# Patient Record
Sex: Female | Born: 1985 | Race: White | Hispanic: No | Marital: Married | Smoking: Former smoker
Health system: Southern US, Community
[De-identification: ages and names within clinical notes are randomized; demographics above are authoritative.]

## PROBLEM LIST (undated history)

## (undated) DIAGNOSIS — J45909 Unspecified asthma, uncomplicated: Secondary | ICD-10-CM

## (undated) DIAGNOSIS — F4310 Post-traumatic stress disorder, unspecified: Secondary | ICD-10-CM

## (undated) HISTORY — PX: TUBAL LIGATION: SHX77

## (undated) HISTORY — PX: DILATION AND CURETTAGE OF UTERUS: SHX78

---
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
## 2005-01-06 ENCOUNTER — Emergency Department: Payer: Self-pay | Admitting: Emergency Medicine

## 2005-04-06 ENCOUNTER — Emergency Department: Payer: Self-pay | Admitting: Emergency Medicine

## 2005-04-06 ENCOUNTER — Ambulatory Visit: Payer: Self-pay | Admitting: Emergency Medicine

## 2005-05-09 ENCOUNTER — Inpatient Hospital Stay: Payer: Self-pay | Admitting: Psychiatry

## 2005-08-17 ENCOUNTER — Emergency Department: Payer: Self-pay | Admitting: Emergency Medicine

## 2005-09-19 ENCOUNTER — Emergency Department: Payer: Self-pay | Admitting: Emergency Medicine

## 2005-10-21 ENCOUNTER — Emergency Department: Payer: Self-pay | Admitting: Emergency Medicine

## 2005-10-22 ENCOUNTER — Ambulatory Visit: Payer: Self-pay

## 2005-10-31 ENCOUNTER — Emergency Department: Payer: Self-pay | Admitting: Emergency Medicine

## 2005-12-04 ENCOUNTER — Emergency Department: Payer: Self-pay | Admitting: Emergency Medicine

## 2005-12-04 ENCOUNTER — Other Ambulatory Visit: Payer: Self-pay

## 2005-12-16 ENCOUNTER — Emergency Department: Payer: Self-pay | Admitting: Emergency Medicine

## 2006-01-03 ENCOUNTER — Inpatient Hospital Stay: Payer: Self-pay | Admitting: Psychiatry

## 2006-04-12 ENCOUNTER — Emergency Department: Payer: Self-pay | Admitting: Internal Medicine

## 2006-04-13 ENCOUNTER — Emergency Department: Payer: Self-pay | Admitting: Emergency Medicine

## 2006-06-10 ENCOUNTER — Emergency Department: Payer: Self-pay | Admitting: Emergency Medicine

## 2007-02-14 ENCOUNTER — Emergency Department: Payer: Self-pay | Admitting: Emergency Medicine

## 2007-04-09 ENCOUNTER — Emergency Department: Payer: Self-pay | Admitting: Internal Medicine

## 2007-04-10 ENCOUNTER — Inpatient Hospital Stay: Payer: Self-pay | Admitting: Psychiatry

## 2007-10-21 ENCOUNTER — Emergency Department: Payer: Self-pay | Admitting: Emergency Medicine

## 2007-10-22 ENCOUNTER — Emergency Department: Payer: Self-pay | Admitting: Emergency Medicine

## 2007-10-24 ENCOUNTER — Ambulatory Visit: Payer: Self-pay | Admitting: Emergency Medicine

## 2008-09-30 ENCOUNTER — Encounter: Payer: Self-pay | Admitting: Obstetrics & Gynecology

## 2008-10-15 ENCOUNTER — Emergency Department: Payer: Self-pay | Admitting: Emergency Medicine

## 2008-10-29 ENCOUNTER — Encounter: Payer: Self-pay | Admitting: Obstetrics & Gynecology

## 2008-11-04 ENCOUNTER — Emergency Department: Payer: Self-pay | Admitting: Emergency Medicine

## 2009-02-13 ENCOUNTER — Observation Stay: Payer: Self-pay | Admitting: Obstetrics & Gynecology

## 2009-02-19 ENCOUNTER — Observation Stay: Payer: Self-pay

## 2009-03-16 ENCOUNTER — Observation Stay: Payer: Self-pay

## 2009-03-29 ENCOUNTER — Inpatient Hospital Stay: Payer: Self-pay

## 2009-12-11 ENCOUNTER — Emergency Department: Payer: Self-pay | Admitting: Unknown Physician Specialty

## 2010-08-02 ENCOUNTER — Emergency Department: Payer: Self-pay | Admitting: Internal Medicine

## 2010-11-14 ENCOUNTER — Observation Stay: Payer: Self-pay | Admitting: Obstetrics and Gynecology

## 2010-12-13 ENCOUNTER — Inpatient Hospital Stay: Payer: Self-pay | Admitting: Obstetrics & Gynecology

## 2010-12-18 LAB — PATHOLOGY REPORT

## 2012-01-12 ENCOUNTER — Emergency Department (HOSPITAL_COMMUNITY)
Admission: EM | Admit: 2012-01-12 | Discharge: 2012-01-12 | Disposition: A | Discharge status: Home / Self Care | Payer: Medicaid Other | Attending: Emergency Medicine | Admitting: Emergency Medicine

## 2012-01-12 ENCOUNTER — Encounter (HOSPITAL_COMMUNITY): Payer: Self-pay

## 2012-01-12 ENCOUNTER — Emergency Department (HOSPITAL_COMMUNITY): Payer: Medicaid Other

## 2012-01-12 DIAGNOSIS — Y92009 Unspecified place in unspecified non-institutional (private) residence as the place of occurrence of the external cause: Secondary | ICD-10-CM | POA: Insufficient documentation

## 2012-01-12 DIAGNOSIS — Y93E1 Activity, personal bathing and showering: Secondary | ICD-10-CM | POA: Insufficient documentation

## 2012-01-12 DIAGNOSIS — M545 Low back pain, unspecified: Secondary | ICD-10-CM | POA: Insufficient documentation

## 2012-01-12 DIAGNOSIS — N760 Acute vaginitis: Secondary | ICD-10-CM

## 2012-01-12 DIAGNOSIS — W1809XA Striking against other object with subsequent fall, initial encounter: Secondary | ICD-10-CM | POA: Insufficient documentation

## 2012-01-12 DIAGNOSIS — N9489 Other specified conditions associated with female genital organs and menstrual cycle: Secondary | ICD-10-CM

## 2012-01-12 DIAGNOSIS — N949 Unspecified condition associated with female genital organs and menstrual cycle: Secondary | ICD-10-CM | POA: Insufficient documentation

## 2012-01-12 DIAGNOSIS — F17200 Nicotine dependence, unspecified, uncomplicated: Secondary | ICD-10-CM | POA: Insufficient documentation

## 2012-01-12 DIAGNOSIS — S0990XA Unspecified injury of head, initial encounter: Secondary | ICD-10-CM

## 2012-01-12 DIAGNOSIS — X118XXA Contact with other hot tap-water, initial encounter: Secondary | ICD-10-CM | POA: Insufficient documentation

## 2012-01-12 LAB — WET PREP, GENITAL
Trich, Wet Prep: NONE SEEN
WBC, Wet Prep HPF POC: NONE SEEN
Yeast Wet Prep HPF POC: NONE SEEN

## 2012-01-12 MED ORDER — OXYCODONE-ACETAMINOPHEN 5-325 MG PO TABS
2.0000 | ORAL_TABLET | Freq: Once | ORAL | Status: AC
  Administered 2012-01-12: 2 via ORAL
  Filled 2012-01-12: qty 2
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
## 2012-01-14 LAB — GC/CHLAMYDIA PROBE AMP
CT Probe RNA: NEGATIVE
GC Probe RNA: NEGATIVE

## 2012-04-15 ENCOUNTER — Emergency Department (HOSPITAL_COMMUNITY): Payer: No Typology Code available for payment source

## 2012-04-15 ENCOUNTER — Emergency Department (HOSPITAL_COMMUNITY)
Admission: EM | Admit: 2012-04-15 | Discharge: 2012-04-15 | Disposition: A | Discharge status: Home / Self Care | Payer: No Typology Code available for payment source | Attending: Emergency Medicine | Admitting: Emergency Medicine

## 2012-04-15 ENCOUNTER — Encounter (HOSPITAL_COMMUNITY): Payer: Self-pay | Admitting: *Deleted

## 2012-04-15 DIAGNOSIS — S79919A Unspecified injury of unspecified hip, initial encounter: Secondary | ICD-10-CM | POA: Insufficient documentation

## 2012-04-15 DIAGNOSIS — S6990XA Unspecified injury of unspecified wrist, hand and finger(s), initial encounter: Secondary | ICD-10-CM | POA: Insufficient documentation

## 2012-04-15 DIAGNOSIS — S59909A Unspecified injury of unspecified elbow, initial encounter: Secondary | ICD-10-CM | POA: Insufficient documentation

## 2012-04-15 DIAGNOSIS — M25552 Pain in left hip: Secondary | ICD-10-CM

## 2012-04-15 DIAGNOSIS — Y939 Activity, unspecified: Secondary | ICD-10-CM | POA: Insufficient documentation

## 2012-04-15 DIAGNOSIS — IMO0002 Reserved for concepts with insufficient information to code with codable children: Secondary | ICD-10-CM | POA: Insufficient documentation

## 2012-04-15 DIAGNOSIS — Y92410 Unspecified street and highway as the place of occurrence of the external cause: Secondary | ICD-10-CM | POA: Insufficient documentation

## 2012-04-15 DIAGNOSIS — M25531 Pain in right wrist: Secondary | ICD-10-CM

## 2012-04-15 DIAGNOSIS — S79929A Unspecified injury of unspecified thigh, initial encounter: Secondary | ICD-10-CM | POA: Insufficient documentation

## 2012-04-15 DIAGNOSIS — F17200 Nicotine dependence, unspecified, uncomplicated: Secondary | ICD-10-CM | POA: Insufficient documentation

## 2012-04-15 MED ORDER — CYCLOBENZAPRINE HCL 10 MG PO TABS: 10.0000 mg | ORAL_TABLET | Freq: Three times a day (TID) | ORAL | Status: DC | PRN

## 2012-04-15 MED ORDER — HYDROCODONE-ACETAMINOPHEN 5-325 MG PO TABS: ORAL_TABLET | ORAL | Status: DC

## 2012-04-15 MED ORDER — OXYCODONE-ACETAMINOPHEN 5-325 MG PO TABS
2.0000 | ORAL_TABLET | Freq: Once | ORAL | Status: AC
  Administered 2012-04-15: 2 via ORAL
  Filled 2012-04-15: qty 2

## 2012-04-15 MED ORDER — NAPROXEN 250 MG PO TABS: 250.0000 mg | ORAL_TABLET | Freq: Two times a day (BID) | ORAL | Status: DC
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
## 2013-02-07 ENCOUNTER — Inpatient Hospital Stay (HOSPITAL_COMMUNITY)
Admission: EM | Admit: 2013-02-07 | Discharge: 2013-02-09 | DRG: 917 | Disposition: A | Discharge status: Other Acute Inpatient Hospital | Payer: Medicaid Other | Attending: Family Medicine | Admitting: Family Medicine

## 2013-02-07 ENCOUNTER — Encounter (HOSPITAL_COMMUNITY): Payer: Self-pay | Admitting: Emergency Medicine

## 2013-02-07 DIAGNOSIS — T4271XA Poisoning by unspecified antiepileptic and sedative-hypnotic drugs, accidental (unintentional), initial encounter: Secondary | ICD-10-CM

## 2013-02-07 DIAGNOSIS — G9340 Encephalopathy, unspecified: Secondary | ICD-10-CM

## 2013-02-07 DIAGNOSIS — Z91041 Radiographic dye allergy status: Secondary | ICD-10-CM

## 2013-02-07 DIAGNOSIS — T426X1A Poisoning by other antiepileptic and sedative-hypnotic drugs, accidental (unintentional), initial encounter: Secondary | ICD-10-CM | POA: Diagnosis present

## 2013-02-07 DIAGNOSIS — T43201A Poisoning by unspecified antidepressants, accidental (unintentional), initial encounter: Secondary | ICD-10-CM

## 2013-02-07 DIAGNOSIS — F329 Major depressive disorder, single episode, unspecified: Secondary | ICD-10-CM | POA: Diagnosis present

## 2013-02-07 DIAGNOSIS — F17200 Nicotine dependence, unspecified, uncomplicated: Secondary | ICD-10-CM | POA: Diagnosis present

## 2013-02-07 DIAGNOSIS — F3289 Other specified depressive episodes: Secondary | ICD-10-CM

## 2013-02-07 DIAGNOSIS — T40601A Poisoning by unspecified narcotics, accidental (unintentional), initial encounter: Secondary | ICD-10-CM | POA: Diagnosis present

## 2013-02-07 DIAGNOSIS — G929 Unspecified toxic encephalopathy: Secondary | ICD-10-CM | POA: Diagnosis present

## 2013-02-07 DIAGNOSIS — A498 Other bacterial infections of unspecified site: Secondary | ICD-10-CM | POA: Diagnosis present

## 2013-02-07 DIAGNOSIS — F319 Bipolar disorder, unspecified: Secondary | ICD-10-CM

## 2013-02-07 DIAGNOSIS — G92 Toxic encephalopathy: Secondary | ICD-10-CM | POA: Diagnosis present

## 2013-02-07 DIAGNOSIS — F32A Depression, unspecified: Secondary | ICD-10-CM | POA: Diagnosis present

## 2013-02-07 DIAGNOSIS — T481X4A Poisoning by skeletal muscle relaxants [neuromuscular blocking agents], undetermined, initial encounter: Principal | ICD-10-CM | POA: Diagnosis present

## 2013-02-07 DIAGNOSIS — T43204A Poisoning by unspecified antidepressants, undetermined, initial encounter: Secondary | ICD-10-CM

## 2013-02-07 DIAGNOSIS — T50901A Poisoning by unspecified drugs, medicaments and biological substances, accidental (unintentional), initial encounter: Secondary | ICD-10-CM

## 2013-02-07 DIAGNOSIS — T43591A Poisoning by other antipsychotics and neuroleptics, accidental (unintentional), initial encounter: Secondary | ICD-10-CM

## 2013-02-07 DIAGNOSIS — T426X4A Poisoning by other antiepileptic and sedative-hypnotic drugs, undetermined, initial encounter: Secondary | ICD-10-CM | POA: Diagnosis present

## 2013-02-07 DIAGNOSIS — Z91013 Allergy to seafood: Secondary | ICD-10-CM

## 2013-02-07 DIAGNOSIS — F209 Schizophrenia, unspecified: Secondary | ICD-10-CM

## 2013-02-07 DIAGNOSIS — N390 Urinary tract infection, site not specified: Secondary | ICD-10-CM | POA: Diagnosis present

## 2013-02-07 LAB — RAPID URINE DRUG SCREEN, HOSP PERFORMED
AMPHETAMINES: NOT DETECTED
BARBITURATES: NOT DETECTED
Benzodiazepines: NOT DETECTED
Cocaine: NOT DETECTED
Opiates: NOT DETECTED
TETRAHYDROCANNABINOL: NOT DETECTED

## 2013-02-07 LAB — CBC WITH DIFFERENTIAL/PLATELET
Basophils Absolute: 0 10*3/uL (ref 0.0–0.1)
Basophils Relative: 0 % (ref 0–1)
EOS PCT: 0 % (ref 0–5)
Eosinophils Absolute: 0 10*3/uL (ref 0.0–0.7)
HEMATOCRIT: 39.1 % (ref 36.0–46.0)
Hemoglobin: 13.6 g/dL (ref 12.0–15.0)
LYMPHS ABS: 1 10*3/uL (ref 0.7–4.0)
LYMPHS PCT: 10 % — AB (ref 12–46)
MCH: 29.8 pg (ref 26.0–34.0)
MCHC: 34.8 g/dL (ref 30.0–36.0)
MCV: 85.7 fL (ref 78.0–100.0)
MONO ABS: 0.7 10*3/uL (ref 0.1–1.0)
MONOS PCT: 7 % (ref 3–12)
Neutro Abs: 8 10*3/uL — ABNORMAL HIGH (ref 1.7–7.7)
Neutrophils Relative %: 82 % — ABNORMAL HIGH (ref 43–77)
Platelets: 155 10*3/uL (ref 150–400)
RBC: 4.56 MIL/uL (ref 3.87–5.11)
RDW: 12.4 % (ref 11.5–15.5)
WBC: 9.7 10*3/uL (ref 4.0–10.5)

## 2013-02-07 LAB — COMPREHENSIVE METABOLIC PANEL
ALT: 13 U/L (ref 0–35)
AST: 13 U/L (ref 0–37)
Albumin: 3.9 g/dL (ref 3.5–5.2)
Alkaline Phosphatase: 72 U/L (ref 39–117)
BUN: 10 mg/dL (ref 6–23)
CALCIUM: 8.8 mg/dL (ref 8.4–10.5)
CO2: 24 meq/L (ref 19–32)
CREATININE: 0.78 mg/dL (ref 0.50–1.10)
Chloride: 105 mEq/L (ref 96–112)
GLUCOSE: 98 mg/dL (ref 70–99)
Potassium: 4.3 mEq/L (ref 3.7–5.3)
Sodium: 140 mEq/L (ref 137–147)
Total Bilirubin: 0.3 mg/dL (ref 0.3–1.2)
Total Protein: 6.9 g/dL (ref 6.0–8.3)

## 2013-02-07 LAB — URINE MICROSCOPIC-ADD ON

## 2013-02-07 LAB — URINALYSIS, ROUTINE W REFLEX MICROSCOPIC
BILIRUBIN URINE: NEGATIVE
Glucose, UA: NEGATIVE mg/dL
Hgb urine dipstick: NEGATIVE
Leukocytes, UA: NEGATIVE
NITRITE: POSITIVE — AB
Protein, ur: NEGATIVE mg/dL
UROBILINOGEN UA: 0.2 mg/dL (ref 0.0–1.0)
pH: 5.5 (ref 5.0–8.0)

## 2013-02-07 LAB — PREGNANCY, URINE: Preg Test, Ur: NEGATIVE

## 2013-02-07 LAB — ACETAMINOPHEN LEVEL: Acetaminophen (Tylenol), Serum: <15 ug/mL (ref 10–30)

## 2013-02-07 LAB — SALICYLATE LEVEL: Salicylate Lvl: <2 mg/dL — ABNORMAL LOW (ref 2.8–20.0)

## 2013-02-07 LAB — ETHANOL

## 2013-02-07 MED ORDER — LEVOFLOXACIN IN D5W 500 MG/100ML IV SOLN
INTRAVENOUS | Status: AC
  Filled 2013-02-07: qty 100

## 2013-02-07 MED ORDER — LEVOFLOXACIN IN D5W 500 MG/100ML IV SOLN
500.0000 mg | Freq: Every day | INTRAVENOUS | Status: DC
  Administered 2013-02-07: 500 mg via INTRAVENOUS
  Filled 2013-02-07 (×2): qty 100

## 2013-02-07 MED ORDER — SODIUM CHLORIDE 0.9 % IV BOLUS (SEPSIS)
1000.0000 mL | Freq: Once | INTRAVENOUS | Status: AC
  Administered 2013-02-07: 1000 mL via INTRAVENOUS

## 2013-02-07 MED ORDER — SODIUM CHLORIDE 0.9 % IV SOLN
INTRAVENOUS | Status: DC
  Administered 2013-02-07 – 2013-02-08 (×2): via INTRAVENOUS

## 2013-02-07 MED ORDER — SODIUM CHLORIDE 0.9 % IJ SOLN
3.0000 mL | Freq: Two times a day (BID) | INTRAMUSCULAR | Status: DC
  Administered 2013-02-08 – 2013-02-09 (×2): 3 mL via INTRAVENOUS

## 2013-02-07 MED ORDER — ENOXAPARIN SODIUM 40 MG/0.4ML ~~LOC~~ SOLN
40.0000 mg | Freq: Every day | SUBCUTANEOUS | Status: DC
  Administered 2013-02-07 – 2013-02-08 (×2): 40 mg via SUBCUTANEOUS
  Filled 2013-02-07 (×3): qty 0.4

## 2013-02-07 NOTE — ED Notes (Signed)
Safety sitter at bedside 

## 2013-02-07 NOTE — ED Notes (Signed)
[facility] and stated we would be receiving a pt named Paula Colon who has reportedly took 48 Flexeril, 2 Lortab, and 1 phenergan. Pt arrived via EMS very lethargic and combative at times. Vitals are WNL. EDP at bedside. Orders received.

## 2013-02-07 NOTE — ED Notes (Signed)
Pt reportedly took 48 x Flexeril tabs, 2 Vicodin, and 1 phenergan of someone elses medicine

## 2013-02-07 NOTE — ED Provider Notes (Signed)
CSN: [id number]     Arrival date & time 02/07/13  1858 History   First MD Initiated Contact with Patient 02/07/13 1907     This chart was scribed for Donnetta HutchingBrian Zayne Marovich, MD by Arlan OrganAshley Leger, ED Scribe. This patient was seen in room APA18/APA18 and the patient's care was started 8:31 PM.   Chief Complaint  Patient presents with  . Drug Overdose   The history is provided by a relative. No language interpreter was used.    LEVEL 5 CAVEAT DUE TO ALTERED MENTAL STATUS  HPI Comments: Paula Colon is a 28 y.o. female who presents to the Emergency Department complaining of an overdose that occurred earlier this afternoon. Relative states pt took about 40 Flexeril obtained from his prescription at home. He says he noted slurred speech around 4:30-5 PM, along with strange behavior as the evening progressed. He states he has never suspect any signs of depression or SI in the pt. Denies any other medical problems, and says pt is otherwise healthy. Denies being on any other prescription medications at this time.  History reviewed. No pertinent past medical history. Past Surgical History  Procedure Laterality Date  . Dilation and curettage of uterus    . Tubal ligation     No family history on file. History  Substance Use Topics  . Smoking status: Current Every Day Smoker    Types: Cigarettes  . Smokeless tobacco: Not on file  . Alcohol Use: No   OB History   Grav Para Term Preterm Abortions TAB SAB Ect Mult Living                 Review of Systems  Unable to perform ROS: Other   Allergies  Bee venom; Shellfish allergy; Ivp dye; and Penicillins  Home Medications  No current outpatient prescriptions on file.  Triage Vitals: BP 109/66  Pulse 109  Temp(Src) 96.7 F (35.9 C) (Rectal)  Resp 20  Wt 180 lb (81.647 kg)  SpO2 99%  Physical Exam  Nursing note and vitals reviewed. Constitutional: She appears well-developed and well-nourished.  Obtunded,  will arouse to sternal rub.    Airway  intact.    HENT:  Head: Normocephalic and atraumatic.  Eyes: Conjunctivae and EOM are normal. Pupils are equal, round, and reactive to light.  Neck: Normal range of motion. Neck supple.  Cardiovascular: Normal rate, regular rhythm and normal heart sounds.   Pulmonary/Chest: Effort normal and breath sounds normal.  Abdominal: Soft. Bowel sounds are normal.  Musculoskeletal:  Unable  Neurological:  Unable  Skin: Skin is warm and dry.  Psychiatric:   unable    ED Course  Procedures (including critical care time)  DIAGNOSTIC STUDIES: Oxygen Saturation is 99% on RA, Normal by my interpretation.    COORDINATION OF CARE: 8:57 PM- Will order blood work. Will consult for possible admission. Discussed treatment plan with pt at bedside and pt agreed to plan.     Labs Review Labs Reviewed  CBC WITH DIFFERENTIAL - Abnormal; Notable for the following:    Neutrophils Relative % 82 (*)    Neutro Abs 8.0 (*)    Lymphocytes Relative 10 (*)    All other components within normal limits  URINALYSIS, ROUTINE W REFLEX MICROSCOPIC - Abnormal; Notable for the following:    Specific Gravity, Urine >1.030 (*)    Ketones, ur TRACE (*)    Nitrite POSITIVE (*)    All other components within normal limits  SALICYLATE LEVEL - Abnormal; Notable for  the following:    Salicylate Lvl <2.0 (*)    All other components within normal limits  URINE MICROSCOPIC-ADD ON - Abnormal; Notable for the following:    Squamous Epithelial / LPF MANY (*)    Bacteria, UA MANY (*)    All other components within normal limits  URINE CULTURE  COMPREHENSIVE METABOLIC PANEL  PREGNANCY, URINE  ETHANOL  URINE RAPID DRUG SCREEN (HOSP PERFORMED)  ACETAMINOPHEN LEVEL   Imaging Review No results found.  EKG Interpretation   None     CRITICAL CARE Performed by: Donnetta Hutching Total critical care time: 30 minutes..Critical care time was exclusive of separately billable procedures and treating other patients. Critical care  was necessary to treat or prevent imminent or life-threatening deterioration. Critical care was time spent personally by me on the following activities: development of treatment plan with patient and/or surrogate as well as nursing, discussions with consultants, evaluation of patient's response to treatment, examination of patient, obtaining history from patient or surrogate, ordering and performing treatments and interventions, ordering and review of laboratory studies, ordering and review of radiographic studies, pulse oximetry and re-evaluation of patient's condition.  MDM   1. Overdose, initial encounter    Patient has suspected overdose with Flexeril.  She appears to be hemodynamically stable. Discussed with poison control. Recommend medical admission and monitoring. No known antidote.  Vital signs remained stable. Discussed with husband.   I personally performed the services described in this documentation, which was scribed in my presence. The recorded information has been reviewed and is accurate.      Donnetta Hutching, MD 02/07/13 2114

## 2013-02-07 NOTE — H&P (Signed)
[facility] History and Physical  Paula Colon ONG:[id number] DOB: 05-Nov-1985 DOA: 02/07/2013  Referring physician:  PCP: No primary provider on file.  Specialists:   Chief Complaint: SSRI overdose   HPI: Paula Colon is a 28 y.o. female schizophrenia, bipolar disorder presented to ED  complaining of SSRI overdose that occurred earlier this afternoon. Patient is confused could not provide any information; per family/mother and her husband she took about 40 Flexeril from husband's prescription at home. He says he noted slurred speech around 4:30-5 PM, along with strange behavior as the evening progressed.she was seen by her mother earlier today and had no medical complaints;  He denied any signs of depression or SI; no other medical history, no chest pain, no SOB, no nausea, vomiting or diarrhea  -ED contacted poison center who recommended medical observation, no specific treatment    Review of Systems: The patient denies anorexia, fever, weight loss,, vision loss, decreased hearing, hoarseness, chest pain, syncope, dyspnea on exertion, peripheral edema, balance deficits, hemoptysis, abdominal pain, melena, hematochezia, severe indigestion/heartburn, hematuria, incontinence, genital sores, muscle weakness, suspicious skin lesions, transient blindness, difficulty walking, depression, unusual weight change, abnormal bleeding, enlarged lymph nodes, angioedema, and breast masses.    History reviewed. No pertinent past medical history. Past Surgical History  Procedure Laterality Date  . Dilation and curettage of uterus    . Tubal ligation     Social History:  reports that she has been smoking Cigarettes.  She has been smoking about 0.00 packs per day. She does not have any smokeless tobacco history on file. She reports that she does not drink alcohol or use illicit drugs. Home;  where does patient live--home, ALF, SNF? and with whom if at home? Yes;  Can patient participate in  ADLs?  Allergies  Allergen Reactions  . Bee Venom Anaphylaxis  . Shellfish Allergy Anaphylaxis  . Ivp Dye [Iodinated Diagnostic Agents]   . Penicillins Rash    No family history on file. no h/o CAD (be sure to complete)  Prior to Admission medications   Not on File   Physical Exam: Filed Vitals:   02/07/13 1946  BP:   Pulse:   Temp: 96.7 F (35.9 C)  Resp:      General: obtunded   Eyes: pupils reactive to light EOM-I  ENT: no oral ulcers   Neck: supple   Cardiovascular: s1,s2 rrr  Respiratory: CTA BL  Abdomen: soft, nt, nd   Skin: no rash  Musculoskeletal: no LE edema   Psychiatric: obtunded   Neurologic: able to move all extremities, confused   Labs on Admission:  Basic Metabolic Panel:  Recent Labs Lab 02/07/13 1931  NA 140  K 4.3  CL 105  CO2 24  GLUCOSE 98  BUN 10  CREATININE 0.78  CALCIUM 8.8   Liver Function Tests:  Recent Labs Lab 02/07/13 1931  AST 13  ALT 13  ALKPHOS 72  BILITOT 0.3  PROT 6.9  ALBUMIN 3.9   No results found for this basename: LIPASE, AMYLASE,  in the last 168 hours No results found for this basename: AMMONIA,  in the last 168 hours CBC:  Recent Labs Lab 02/07/13 1931  WBC 9.7  NEUTROABS 8.0*  HGB 13.6  HCT 39.1  MCV 85.7  PLT 155   Cardiac Enzymes: No results found for this basename: CKTOTAL, CKMB, CKMBINDEX, TROPONINI,  in the last 168 hours  BNP (last 3 results) No results found for this basename: PROBNP,  in the last  8760 hours CBG: No results found for this basename: GLUCAP,  in the last 168 hours  Radiological Exams on Admission: No results found.  EKG: Independently reviewed. Pend   Assessment/Plan Principal Problem:   Overdose of antidepressant Active Problems:   Depression   Schizophrenia   Bipolar 1 disorder  28 y.o. female schizophrenia, bipolar disorder presented to ED  complaining of SSRI overdose that occurred earlier this afternoon. Patient is confused could not  provide any information; per family/mother and her husband she took about 40 Flexeril from husband's prescription at home  1. Acute encephalopathy, SSRI overdose; hemodynamically stable;  -monitor tele/SDU; ECG/QT-pend; neuro checks, supportive care, close monitor respiratory status for airway protection;   -urine tox neg; recheck tylenol level in 4 hours   2. Schizophrenia, bipolar disorder; not on any meds for 4 years per her mother  -need psychiatry eval;   3. Probable UTI, empiric atx, f/u cultures   None;  if consultant consulted, please document name and whether formally or informally consulted  Code Status: full (must indicate code status--if unknown or must be presumed, indicate so) Family Communication: d/w her mother Mother [phone] (indicate person spoken with, if applicable, with phone number if by telephone) Disposition Plan: need psychiatry eval  (indicate anticipated LOS)  Time spent: >35 minutes   Esperanza SheetsBURIEV, Solei Wubben N [facility] Pager [phone]  If 7PM-7AM, please contact night-coverage [email or web address] Password [email or web address] 02/07/2013, 9:29 PM

## 2013-02-08 DIAGNOSIS — G9340 Encephalopathy, unspecified: Secondary | ICD-10-CM

## 2013-02-08 LAB — COMPREHENSIVE METABOLIC PANEL
ALT: 10 U/L (ref 0–35)
AST: 11 U/L (ref 0–37)
Albumin: 3.4 g/dL — ABNORMAL LOW (ref 3.5–5.2)
Alkaline Phosphatase: 67 U/L (ref 39–117)
BUN: 9 mg/dL (ref 6–23)
CALCIUM: 8.5 mg/dL (ref 8.4–10.5)
CO2: 22 mEq/L (ref 19–32)
Chloride: 109 mEq/L (ref 96–112)
Creatinine, Ser: 0.77 mg/dL (ref 0.50–1.10)
GFR calc non Af Amer: >90 mL/min (ref >90)
GLUCOSE: 79 mg/dL (ref 70–99)
Potassium: 3.8 mEq/L (ref 3.7–5.3)
SODIUM: 143 meq/L (ref 137–147)
TOTAL PROTEIN: 6.4 g/dL (ref 6.0–8.3)
Total Bilirubin: 0.5 mg/dL (ref 0.3–1.2)

## 2013-02-08 LAB — CBC
HEMATOCRIT: 36.6 % (ref 36.0–46.0)
HEMOGLOBIN: 12.7 g/dL (ref 12.0–15.0)
MCH: 30 pg (ref 26.0–34.0)
MCHC: 34.7 g/dL (ref 30.0–36.0)
MCV: 86.5 fL (ref 78.0–100.0)
Platelets: 167 10*3/uL (ref 150–400)
RBC: 4.23 MIL/uL (ref 3.87–5.11)
RDW: 12.6 % (ref 11.5–15.5)
WBC: 8 10*3/uL (ref 4.0–10.5)

## 2013-02-08 LAB — MRSA PCR SCREENING: MRSA BY PCR: NEGATIVE

## 2013-02-08 LAB — ACETAMINOPHEN LEVEL: Acetaminophen (Tylenol), Serum: <15 ug/mL (ref 10–30)

## 2013-02-08 LAB — GLUCOSE, CAPILLARY: GLUCOSE-CAPILLARY: 100 mg/dL — AB (ref 70–99)

## 2013-02-08 MED ORDER — LEVOFLOXACIN 250 MG PO TABS
250.0000 mg | ORAL_TABLET | Freq: Every day | ORAL | Status: DC
  Administered 2013-02-08 – 2013-02-09 (×2): 250 mg via ORAL
  Filled 2013-02-08 (×2): qty 1

## 2013-02-08 NOTE — Progress Notes (Signed)
MD wrote orders to transfer to medical floor.  No beds available per medical floor charge RN.  Will transfer patient when bed available. Schonewitz, Candelaria StagersLeigh Anne 02/08/2013

## 2013-02-08 NOTE — Progress Notes (Signed)
[facility] PROGRESS NOTE  Nona Dellicole M Galan AOZ:[id number]:[id number] DOB: [birth year] DOA: 02/07/2013 PCP: No primary provider on file.  Summary: 28 year old woman with history of schizophrenia, bipolar disorder presented to emergency department with history of overdose with 48 Flexeril, 2 Lortab, one Phenergan. Noted to be obtunded on admission. Poison control recommended medical observation, no specific treatment.  Assessment/Plan: 1. Acute encephalopathy, resolved. Secondary to polysubstance overdose. 2. Polysubstance overdose with Flexeril, Lortab, Phenergan. No obvious sequela. Supportive care. Poison control. Patient denies suicidal ideation, attempt. 3. Possible UTI. 4. History of schizophrenia, bipolar disorder. Not on any outpatient medications.   Clinically improved. Plan transfer to telemetry, repeat laboratory studies in the morning, likely medically clear 1/12. Psychiatry consultation 1/12. Corporate investment bankerContinue sitter. History is discordant.  Continue treatment for possible UTI.  Discussed with patient above.  Pending studies:   Urine culture  Code Status: full code DVT prophylaxis: Lovenox Family Communication: none Disposition Plan: pending psychiatry evaluation  Brendia Sacksaniel Sakari Raisanen, MD  [facility]  Pager [phone] If 7PM-7AM, please contact night-coverage at [email or web address], password [email or web address] 02/08/2013, 7:39 AM  LOS: 1 day   Consultants:    Procedures:    Antibiotics:  Levaquin 1/10 >>  HPI/Subjective: No issues overnight per RN. Patient denies complaints. No pain, nausea or vomiting. She denies suicide attempt or suicidal ideation. She reports only taking a few tablets of Flexeril and no other medications. Sitter remains at bedside.  Objective: Filed Vitals:   02/08/13 0400 02/08/13 0450 02/08/13 0500 02/08/13 0700  BP:  111/72    Pulse:      Temp: 98.1 F (36.7 C)     TempSrc: Oral     Resp: 18  17 14   Height:      Weight:   79.4 kg (175 lb 0.7 oz)   SpO2:         Intake/Output Summary (Last 24 hours) at 02/08/13 0739 Last data filed at 02/08/13 0500  Gross per 24 hour  Intake   2660 ml  Output    950 ml  Net   1710 ml     Filed Weights   02/07/13 1919 02/07/13 2310 02/08/13 0500  Weight: 81.647 kg (180 lb) 78.6 kg (173 lb 4.5 oz) 79.4 kg (175 lb 0.7 oz)    Exam:   Afebrile, vital signs stable. No hypoxia.  General: Appears calm and comfortable. Well-appearing.  Psychiatric: Grossly normal mood and affect. Speech fluent and appropriate.  Cardiovascular: Regular rate and rhythm. No murmur, rub or gallop. No lower extremity edema.  Respiratory: Clear to auscultation bilaterally. No wheezes, rales or rhonchi. Normal respiratory effort.  Neurologic: Grossly normal.  Data Reviewed:  Complete metabolic panel unremarkable. CBC normal.  Tylenol level negative x2. Salicylate level negative.  Glucose stable.  Urine pregnancy negative.  Urinalysis equivocal.  Alcohol level and urine drug screen were negative.  EKG on admission showed sinus rhythm with no acute changes.  Scheduled Meds: . enoxaparin (LOVENOX) injection  40 mg Subcutaneous QHS  . levofloxacin (LEVAQUIN) IV  500 mg Intravenous QHS  . sodium chloride  3 mL Intravenous Q12H   Continuous Infusions: . sodium chloride 100 mL/hr at 02/08/13 0500    Principal Problem:   Overdose of antidepressant Active Problems:   Depression   Schizophrenia   Bipolar 1 disorder   Overdose   Acute encephalopathy   Time spent 20 minutes

## 2013-02-08 NOTE — Progress Notes (Signed)
[facility].  Staff stated that patient would be seen in person tomorrow morning.  Will pass on in RN report. Schonewitz, Candelaria StagersLeigh Anne 02/08/2013

## 2013-02-08 NOTE — Progress Notes (Signed)
Informed by sitter  husband had brought bag to hospital room. Husband stated he had his meds in bag. flexeril and asprin found in bag along with clothes, drinks snacks and personal hygiene items. informed husband his med would have to be removed from room for pt safety and would be in med room

## 2013-02-09 ENCOUNTER — Encounter (HOSPITAL_COMMUNITY): Payer: Self-pay | Admitting: Behavioral Health

## 2013-02-09 ENCOUNTER — Inpatient Hospital Stay (HOSPITAL_COMMUNITY)
Admission: AD | Admit: 2013-02-09 | Discharge: 2013-02-12 | DRG: 885 | Disposition: A | Discharge status: Home / Self Care | Payer: Medicaid Other | Source: Intra-hospital | Attending: Psychiatry | Admitting: Psychiatry

## 2013-02-09 DIAGNOSIS — R45851 Suicidal ideations: Secondary | ICD-10-CM

## 2013-02-09 DIAGNOSIS — F329 Major depressive disorder, single episode, unspecified: Secondary | ICD-10-CM | POA: Diagnosis present

## 2013-02-09 DIAGNOSIS — T50901A Poisoning by unspecified drugs, medicaments and biological substances, accidental (unintentional), initial encounter: Secondary | ICD-10-CM

## 2013-02-09 DIAGNOSIS — G9340 Encephalopathy, unspecified: Secondary | ICD-10-CM | POA: Diagnosis present

## 2013-02-09 DIAGNOSIS — F32A Depression, unspecified: Secondary | ICD-10-CM | POA: Diagnosis present

## 2013-02-09 DIAGNOSIS — F319 Bipolar disorder, unspecified: Secondary | ICD-10-CM

## 2013-02-09 DIAGNOSIS — F332 Major depressive disorder, recurrent severe without psychotic features: Secondary | ICD-10-CM | POA: Diagnosis present

## 2013-02-09 DIAGNOSIS — F3112 Bipolar disorder, current episode manic without psychotic features, moderate: Principal | ICD-10-CM | POA: Diagnosis present

## 2013-02-09 HISTORY — DX: Post-traumatic stress disorder, unspecified: F43.10

## 2013-02-09 HISTORY — DX: Unspecified asthma, uncomplicated: J45.909

## 2013-02-09 LAB — COMPREHENSIVE METABOLIC PANEL
ALBUMIN: 3.4 g/dL — AB (ref 3.5–5.2)
ALT: 9 U/L (ref 0–35)
AST: 10 U/L (ref 0–37)
Alkaline Phosphatase: 67 U/L (ref 39–117)
BUN: 11 mg/dL (ref 6–23)
CO2: 24 mEq/L (ref 19–32)
CREATININE: 0.9 mg/dL (ref 0.50–1.10)
Calcium: 8.8 mg/dL (ref 8.4–10.5)
Chloride: 105 mEq/L (ref 96–112)
GFR calc non Af Amer: 87 mL/min — ABNORMAL LOW (ref >90)
Glucose, Bld: 89 mg/dL (ref 70–99)
Potassium: 3.9 mEq/L (ref 3.7–5.3)
Sodium: 141 mEq/L (ref 137–147)
TOTAL PROTEIN: 6.2 g/dL (ref 6.0–8.3)
Total Bilirubin: 0.4 mg/dL (ref 0.3–1.2)

## 2013-02-09 MED ORDER — ALUM & MAG HYDROXIDE-SIMETH 200-200-20 MG/5ML PO SUSP: 30.0000 mL | ORAL | Status: DC | PRN

## 2013-02-09 MED ORDER — HYDROXYZINE HCL 50 MG PO TABS: 50.0000 mg | ORAL_TABLET | Freq: Every evening | ORAL | Status: DC | PRN

## 2013-02-09 MED ORDER — NICOTINE 21 MG/24HR TD PT24
21.0000 mg | MEDICATED_PATCH | Freq: Every day | TRANSDERMAL | Status: DC
  Administered 2013-02-09: 21 mg via TRANSDERMAL
  Filled 2013-02-09: qty 1

## 2013-02-09 MED ORDER — ACETAMINOPHEN 325 MG PO TABS: 650.0000 mg | ORAL_TABLET | Freq: Four times a day (QID) | ORAL | Status: DC | PRN

## 2013-02-09 MED ORDER — MAGNESIUM HYDROXIDE 400 MG/5ML PO SUSP: 30.0000 mL | Freq: Every day | ORAL | Status: DC | PRN

## 2013-02-09 NOTE — Progress Notes (Signed)
PT TRANSFERRING TO  ROOM 311. PT ALERT AND ORIENTED. RT ARM NSL INTACT AND PATENT. HUSBAND AND SUICIDE SITTER AT BEDSIDE. NO SUICIDIAL  BEHAVIOR OBSERVED.  TRANSFER REPORT CALLED TO 300 RN.
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
## 2013-02-09 NOTE — Care Management Note (Signed)
    Page 1 of 1   02/09/2013     2:58:45 PM   CARE MANAGEMENT NOTE 02/09/2013  Patient:  Paula Colon   Account Number:  [id number]  Date Initiated:  02/09/2013  Documentation initiated by:  Sharrie RothmanBLACKWELL,Kirrah Mustin C  Subjective/Objective Assessment:   Pt admitted from home with drug overdose. Pt lives with her husband and is independent with ADl's.     Action/Plan:   Pt will need inpatient psych treatment prior to discharge home. [facility]   Anticipated DC Date:  02/10/2013   Anticipated DC Plan:  PSYCHIATRIC HOSPITAL      DC Planning Services  CM consult      Choice offered to / List presented to:             Status of service:  Completed, signed off Medicare Important Message given?   (If response is "NO", the following Medicare IM given date fields will be blank) Date Medicare IM given:   Date Additional Medicare IM given:    Discharge Disposition:  PSYCHIATRIC HOSPITAL  Per UR Regulation:    If discussed at Long Length of Stay Meetings, dates discussed:    Comments:  02/09/13 1500 Arlyss Queenammy Zaleah Ternes, RN BSN CM

## 2013-02-09 NOTE — Progress Notes (Signed)
Writer scheduled Tele Assessment for 1:00 pm

## 2013-02-09 NOTE — Progress Notes (Signed)
Writer informed the ER MD (dr. Irene LimboGoodrich) that [facility]  Dr. Addison NaegeliJonalagadda is the accepting doctor.   Writer faxed the support paperwork to [facility] [phone].  Writer informed the nurse that the paperwork has to be faxed before [facility].  The RN can call report bto the adult unit at [phone].

## 2013-02-09 NOTE — Progress Notes (Signed)
Patient was listed in the Consultation Log for a Psychiatrist to come to the patients hospital.  Our Psychiatrists do not come to [facility] ED for consults.   The patient was not listed on the Tele Assessment/Tele Psych consult Shift report.  Therefore, a TTS counselor was not able to follow the patient.    The patient is not on the shift report and a staff member will contact the RN working with the patient to schedule a Tele Assessment.
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
## 2013-02-09 NOTE — Clinical Social Work Note (Signed)
CSW [facility] w Ava, [facility].  Consult will be done via teleassessment, [facility] time for assessment is arranged.  Santa GeneraAnne Nagi Furio, LCSW Clinical Social Worker [phone])

## 2013-02-09 NOTE — Progress Notes (Signed)
[facility] PROGRESS NOTE  Nona Dellicole M Hailu ONG:[id number]:[id number] DOB: 1985/12/31 DOA: 02/07/2013 PCP: No primary provider on file.  Summary: 28 year old woman with history of schizophrenia, bipolar disorder presented to emergency department with history of overdose with 48 Flexeril, 2 Lortab, one Phenergan according to ED documentation. Noted to be obtunded on admission. Poison control recommended medical observation, no specific treatment. She improved rapidly with supportive care and denies suicidal intent/ideation. Husband at bedside today reports initial information was incorrect and that she could only possibly taken a few tablets of Flexeril as this is his medication and he had used up most of it. Patient admits to maybe taking 1 Phenergan. No Lortab. She is medically clear at this time await psychiatry recommendations.  Assessment/Plan: 1. Acute encephalopathy, resolved. Secondary to polysubstance overdose. 2. Polysubstance overdose with Flexeril, Lortab, Phenergan. No obvious sequela. Supportive care per poison control. Patient denies suicidal ideation, attempt. 3. E coli UTI. Completed antibiotics. 4. History of schizophrenia, bipolar disorder. Not on any outpatient medications.   Patient is medically clear for discharge/transfer.  Completed treatment for UTI.   Psychiatry consultation this morning. Patient continues to deny suicidal intent, see further information above.  Disposition pending psychiatry recommendations.  Pending studies:   Urine culture  Code Status: full code DVT prophylaxis: Lovenox Family Communication: Discussed with husband at bedside with patient's permission Disposition Plan: pending psychiatry evaluation  Brendia Sacksaniel Shakara Tweedy, MD  [facility]  Pager [phone] If 7PM-7AM, please contact night-coverage at [email or web address], password [email or web address] 02/09/2013, 10:42 AM  LOS: 2 days   Consultants:  Psychiatry  Procedures:    Antibiotics:  Levaquin 1/10  >>  HPI/Subjective: Doing well. Patient has no complaints. Feels well. She denies suicidal ideation. No suicidal intent.  Objective: Filed Vitals:   02/09/13 0600 02/09/13 0700 02/09/13 0755 02/09/13 0800  BP: 100/52   96/67  Pulse:      Temp:   98.3 F (36.8 C)   TempSrc:   Oral   Resp: 18 17  18   Height:      Weight:      SpO2:        Intake/Output Summary (Last 24 hours) at 02/09/13 1042 Last data filed at 02/09/13 0900  Gross per 24 hour  Intake    120 ml  Output    300 ml  Net   -180 ml     Filed Weights   02/07/13 2310 02/08/13 0500 02/09/13 0500  Weight: 78.6 kg (173 lb 4.5 oz) 79.4 kg (175 lb 0.7 oz) 79 kg (174 lb 2.6 oz)    Exam:   Afebrile, vital signs stable. No hypoxia.  General: Appears well.  Cardiovascular: Regular rate and rhythm. No murmur, rub or gallop.  Respiratory: Clear to auscultation bilaterally. No wheezes, rales or rhonchi. Normal respiratory effort.  Psychiatric: Grossly normal mood and affect. Speech fluent and appropriate.  Telemetry sinus rhythm  Data Reviewed:  Complete metabolic panel unremarkable.  Scheduled Meds: . enoxaparin (LOVENOX) injection  40 mg Subcutaneous QHS  . levofloxacin  250 mg Oral Daily  . sodium chloride  3 mL Intravenous Q12H   Continuous Infusions:    Principal Problem:   Overdose of antidepressant Active Problems:   Depression   Schizophrenia   Bipolar 1 disorder   Overdose   Acute encephalopathy

## 2013-02-09 NOTE — Progress Notes (Signed)
[facility] occupationaloelle [facility]

## 2013-02-09 NOTE — Clinical Social Work Note (Signed)
CSW completed IVC paperwork and faxed to magistrate's office. Confirmed received and provided number for RN to contact Sheriff's office [phone]) after 6 pm to inquire about anticipated time of transport. Original and copies of IVC paperwork on chart.   Derenda FennelKara Britny Riel, Kentucky[place]CSW [phone]

## 2013-02-09 NOTE — Progress Notes (Signed)
Given the Initial reports of the patient consuming over "40" or "48" tablets of Flexeril among 2 other types of medication, some of which were narcotics, the patient's and family member's new and different statement about lower numbers of medication consumed is not a valid consideration for changing our medical course of action at this time. Additionally, taking into account the patient's previous history of Schizophrenia, Bipolar Disorder, and psychiatric hospitalization for suicidal ideation with an attempted overdose, the patient [facility]. If the patient refuses this hospitalization, she meets criteria for involuntary commitment (IVC papers).    Richardean ChimeraJ. Conrad Withrow, MSN, FNP-BC  [facility]  02/09/2013 at 3:31PM

## 2013-02-09 NOTE — Progress Notes (Signed)
UR completed. Patient changed to inpatient r/t on admission patient was confused and at times combative. Required telemetry monitoring and IVF @ 100cc/hr

## 2013-02-09 NOTE — Discharge Summary (Signed)
Physician Discharge Summary  Paula Colon YNW:[id number] DOB: 1985/03/28 DOA: 02/07/2013  PCP: No primary provider on file.  Admit date: 02/07/2013 Discharge date: 02/09/2013  Recommendations for Outpatient Follow-up:  1. Polysubstance overdose   Discharge Diagnoses:  1. Acute encephalopathy 2. Polysubstance overdose with clots stroke, Lortab, Phenergan by report 3. E coli UTI  4. History of schizophrenia, bipolar disorder  Discharge Condition: Improved Disposition: Transfer to [facility] for inpatient psychiatric stabilization under an involuntary commitment  Diet recommendation: Regular  Filed Weights   02/07/13 2310 02/08/13 0500 02/09/13 0500  Weight: 78.6 kg (173 lb 4.5 oz) 79.4 kg (175 lb 0.7 oz) 79 kg (174 lb 2.6 oz)    History of present illness:  28 year old woman with history of schizophrenia, bipolar disorder presented to emergency department with history of overdose with 48 Flexeril, 2 Lortab, one Phenergan according to ED documentation. Noted to be obtunded on admission. Poison control recommended medical observation, no specific treatment.  Hospital Course:  Patient was observed in the step down unit and rapidly stabilized with resolution of encephalopathy. No observed sequela from overdose and laboratory studies were unremarkable. She was treated for a bladder infection and has completed treatment. She continues to deny suicidal ideation, suicide attempt and denies taking so many pills. She was seen in consultation by psychiatry and case discussed with Constance HawJohn Withrow, FNP who has documented that the patient requires inpatient stabilization. Patient refuses, and therefore IVC placed per psychiatry recommendations. She is medically stable for transport.  1. Acute encephalopathy, resolved. Secondary to polysubstance overdose. 2. Polysubstance overdose with Flexeril, Lortab, Phenergan. No obvious sequela. Supportive care per poison control. Patient denies  suicidal ideation, attempt. 3. E coli UTI. Completed antibiotics. 4. History of schizophrenia, bipolar disorder. Not on any outpatient medications.  Consultants:  Psychiatry Procedures:  Antibiotics:  Levaquin 1/10 >> 1/12  Discharge Instructions     Medication List    Notice   You have not been prescribed any medications.     Allergies  Allergen Reactions  . Bee Venom Anaphylaxis  . Shellfish Allergy Anaphylaxis  . Ivp Dye [Iodinated Diagnostic Agents]   . Penicillins Rash    The results of significant diagnostics from this hospitalization (including imaging, microbiology, ancillary and laboratory) are listed below for reference.    Significant Diagnostic Studies: No results found.  Microbiology: Recent Results (from the past 240 hour(s))  URINE CULTURE     Status: None   Collection Time    02/07/13  7:30 PM      Result Value Range Status   Specimen Description URINE, CATHETERIZED   Final   Special Requests NONE   Final   Culture  Setup Time     Final   Value: 02/08/2013 19:58     Performed at [facility]   Colony Count     Final   Value: >=100,000 COLONIES/ML     Performed at [facility]   Culture     Final   Value: ESCHERICHIA COLI     Performed at [facility]   Report Status PENDING   Incomplete  MRSA PCR SCREENING     Status: None   Collection Time    02/08/13  1:00 AM      Result Value Range Status   MRSA by PCR NEGATIVE  NEGATIVE Final   Comment:            The GeneXpert MRSA Assay (FDA     approved for NASAL specimens  only), is one component of a     comprehensive MRSA colonization     surveillance program. It is not     intended to diagnose MRSA     infection nor to guide or     monitor treatment for     MRSA infections.     Labs: Basic Metabolic Panel:  Recent Labs Lab 02/07/13 1931 02/08/13 0507 02/09/13 0522  NA 140 143 141  K 4.3 3.8 3.9  CL 105 109 105  CO2 24 22 24   GLUCOSE 98 79 89  BUN 10  9 11   CREATININE 0.78 0.77 0.90  CALCIUM 8.8 8.5 8.8   Liver Function Tests:  Recent Labs Lab 02/07/13 1931 02/08/13 0507 02/09/13 0522  AST 13 11 10   ALT 13 10 9   ALKPHOS 72 67 67  BILITOT 0.3 0.5 0.4  PROT 6.9 6.4 6.2  ALBUMIN 3.9 3.4* 3.4*   CBC:  Recent Labs Lab 02/07/13 1931 02/08/13 0507  WBC 9.7 8.0  NEUTROABS 8.0*  --   HGB 13.6 12.7  HCT 39.1 36.6  MCV 85.7 86.5  PLT 155 167   CBG:  Recent Labs Lab 02/08/13 0837  GLUCAP 100*    Principal Problem:   Overdose of antidepressant Active Problems:   Depression   Schizophrenia   Bipolar 1 disorder   Overdose   Acute encephalopathy   Drug overdose   Time coordinating discharge: 40 minutes  Signed:  Brendia Sacks, MD [facility] 02/09/2013, 4:09 PM

## 2013-02-10 DIAGNOSIS — F329 Major depressive disorder, single episode, unspecified: Secondary | ICD-10-CM

## 2013-02-10 DIAGNOSIS — F3289 Other specified depressive episodes: Secondary | ICD-10-CM

## 2013-02-10 DIAGNOSIS — F319 Bipolar disorder, unspecified: Secondary | ICD-10-CM

## 2013-02-10 DIAGNOSIS — T50901A Poisoning by unspecified drugs, medicaments and biological substances, accidental (unintentional), initial encounter: Secondary | ICD-10-CM

## 2013-02-10 LAB — URINE CULTURE

## 2013-02-10 MED ORDER — HYDROXYZINE HCL 25 MG PO TABS: 25.0000 mg | ORAL_TABLET | Freq: Four times a day (QID) | ORAL | Status: DC | PRN

## 2013-02-10 MED ORDER — BUSPIRONE HCL 15 MG PO TABS
7.5000 mg | ORAL_TABLET | Freq: Two times a day (BID) | ORAL | Status: DC
  Administered 2013-02-11: 7.5 mg via ORAL
  Filled 2013-02-10: qty 14
  Filled 2013-02-10: qty 1
  Filled 2013-02-10: qty 14
  Filled 2013-02-10 (×5): qty 1

## 2013-02-10 MED ORDER — TRAZODONE HCL 50 MG PO TABS
50.0000 mg | ORAL_TABLET | Freq: Every day | ORAL | Status: DC
  Filled 2013-02-10 (×4): qty 1

## 2013-02-10 MED ORDER — METHOCARBAMOL 500 MG PO TABS: 500.0000 mg | ORAL_TABLET | Freq: Four times a day (QID) | ORAL | Status: DC | PRN

## 2013-02-10 MED ORDER — NICOTINE 21 MG/24HR TD PT24
21.0000 mg | MEDICATED_PATCH | Freq: Every day | TRANSDERMAL | Status: DC
  Administered 2013-02-10 – 2013-02-12 (×3): 21 mg via TRANSDERMAL
  Filled 2013-02-10 (×4): qty 1

## 2013-02-10 NOTE — [facility] Suicide Risk Assessment (Signed)
Suicide Risk Assessment  Admission Assessment     Nursing information obtained from:    Demographic factors:    Current Mental Status:    Loss Factors:    Historical Factors:    Risk Reduction Factors:     CLINICAL FACTORS:   Severe Anxiety and/or Agitation Depression:   Anhedonia Hopelessness Impulsivity Insomnia Recent sense of peace/wellbeing Severe Chronic Pain Previous Psychiatric Diagnoses and Treatments Medical Diagnoses and Treatments/Surgeries  COGNITIVE FEATURES THAT CONTRIBUTE TO RISK:  Closed-mindedness Loss of executive function Polarized thinking    SUICIDE RISK:   Moderate:  Frequent suicidal ideation with limited intensity, and duration, some specificity in terms of plans, no associated intent, good self-control, limited dysphoria/symptomatology, some risk factors present, and identifiable protective factors, including available and accessible social support.  PLAN OF CARE: Admitted emergently and [facility] from [facility] department for depression, anxiety and overdosed on medication. Patient needed crisis stabilization, safety monitoring on medication management.  I certify that inpatient services furnished can reasonably be expected to improve the patient's condition.  Liseth Wann,JANARDHAHA R. 02/10/2013, 11:48 AM

## 2013-02-10 NOTE — [facility] Suicide Risk Assessment (Signed)
[facility] INPATIENT:  Family/Significant Other Suicide Prevention Education  Suicide Prevention Education:  Education Completed; Paula Colon, Husband, [phone]; has been identified by the patient as the family member/significant other with whom the patient will be residing, and identified as the person(s) who will aid the patient in the event of a mental health crisis (suicidal ideations/suicide attempt).  With written consent from the patient, the family member/significant other has been provided the following suicide prevention education, prior to the and/or following the discharge of the patient.  The suicide prevention education provided includes the following:  Suicide risk factors  Suicide prevention and interventions  National Suicide Hotline telephone number  [facility] assessment telephone number  [facility] Emergency Assistance 911  [facility] and/or [facility]idential Mobile Crisis Unit telephone number  Request made of family/significant other to:  Remove weapons (e.g., guns, rifles, knives), all items previously/currently identified as safety concern.  Husband advised gun has been removed from the home.  Remove drugs/medications (over-the-counter, prescriptions, illicit drugs), all items previously/currently identified as a safety concern.  The family member/significant other verbalizes understanding of the suicide prevention education information provided.  The family member/significant other agrees to remove the items of safety concern listed above.  Paula Colon, Paula Colon 02/10/2013, 3:42 PM
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
## 2013-02-10 NOTE — Progress Notes (Signed)
The focus of this group is to educate the patient on the purpose and policies of crisis stabilization and provide a format to answer questions about their admission.  The group details unit policies and expectations of patients while admitted.  Patient attended 0900 nurse education orientation group this morning.  Patient actively participated, appropriate affect, alert, appropriate insight and engagement.  Today patient will work on 3 goals for discharge.  

## 2013-02-10 NOTE — Progress Notes (Signed)
D: Pt denies SI/HI/AVH. Pt is pleasant and cooperative. Pt stated she "came here on a fluke". Pt continues to minimize her being here.   A: Pt was offered support and encouragement. Pt was given scheduled medications. Pt was encourage to attend groups. Q 15 minute checks were done for safety.   R:Pt attends groups and interacts well with peers and staff. Pt is taking medication. Pt has no complaints at this time.Pt receptive to treatment and safety maintained on unit.
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
## 2013-02-10 NOTE — [facility] Group Notes (Signed)
[facility] LCSW Group Therapy      Feelings About Diagnosis 1:15 - 2:30 PM         02/10/2013  3:41 PM    Type of Therapy:  Group Therapy  Participation Level:  Active  Participation Quality:  Appropriate  Affect:  Appropriate  Cognitive:  Alert and Appropriate  Insight:  Developing/Improving and Engaged  Engagement in Therapy:  Developing/Improving and Engaged  Modes of Intervention:  Discussion, Education, Exploration, Problem-Solving, Rapport Building, Support  Summary of Progress/Problems:  Patient actively participated in group. Patient discussed past and present diagnosis and the effects it has had on  Life. She advised of having difficult at a younger age with her diagnosis but learned to accept it.  Patient talked about family and society being judgmental and the stigma associated with having a mental health diagnosis.  Paula Colon, Paula Colon 02/10/2013  3:41 PM

## 2013-02-10 NOTE — Progress Notes (Signed)
[facility] officer. Pt calm and cooperative. Pt safe and stable. Belongings with pt. IV and tele removed. IV site clean and dry.

## 2013-02-10 NOTE — Progress Notes (Signed)
Recreation Therapy Notes  nimal-Assisted Activity/Therapy (AAA/T) Program Checklist/Progress Notes Patient Eligibility Criteria Checklist & Daily Group note for Rec Tx Intervention  Date: 01.13.2015 Time: 2:45pm [address]   AAA/T Program Assumption of Risk Form signed by Patient/ or Parent Legal Guardian yes  Patient is free of allergies or sever asthma yes  Patient reports no fear of animals yes  Patient reports no history of cruelty to animals yes   Patient understands his/her participation is voluntary yes  Patient washes hands before animal contact yes  Patient washes hands after animal contact yes  Behavioral Response: Appropriate   Education: Hand Washing, Appropriate Animal Interaction   Education Outcome: Acknowledges understanding   Clinical Observations/Feedback: Patient interacted appropriately with therapy dog team, peers and LRT.    Marykay Lexenise L Liat Mayol, LRT/CTRS  Meline Russaw L 02/10/2013 5:25 PM

## 2013-02-10 NOTE — Progress Notes (Signed)
Patient ID: Paula Colon, female   DOB: 1985-11-19, 28 y.o.   MRN: [id number] D- Patient says that she is not depressed and not anxious, "I shouldn't even be here".  She is hoping to be discharged soon and says in the future she will not take her husband's medicine.  She has had no requests today, she has no complaints  and is quiet and pleasant on approach.

## 2013-02-11 NOTE — Progress Notes (Signed)
Recreation Therapy Notes  Date: 01.14.2015 Time: 3:00 Location: [address] Day[address]   Group Topic: Communication, Team Building, Problem Solving  Goal Area(s) Addresses:  Patient will effectively work with peer towards shared goal.  Patient will identify skill used to make activity successful.  Patient will identify how skills used during activity can be used to reach post d/c goals.   Behavioral Response: Engaged, Attentive, Appropriate   Intervention: Problem Solving Activitiy  Activity: Life Boat. Patients were given a scenario about being on a sinking yacht. Patients were informed the yacht included 15 guest, 8 of which could be placed on the life boat, along with all group members. Individuals on guest list were of varying socioeconomic classes such as a Education officer, museumriest, Materials engineerresident Obama, MidwifeBus Driver, Tree surgeonTeacher and Chef.   Education: Pharmacist, communityocial Skills, Discharge Planning   Education Outcome: Acknowledges understanding  Clinical Observations/Feedback: Patient actively engaged in group activity, voicing her opinion and debating with peers appropriately.   At approximately 3:20pm group session was ended due to group member showing signs of seizure like behavior.   Patient with peers approached LRT in hallway following group session and asked her to finish group, LRT complied with patient request. Patient actively listened as peers identify group skills and relating those skills to their post d/c goals. Patient was observed to nod in agreement with points raised by peers.   Marykay Lexenise L Joscelynn Brutus, LRT/CTRS  Dujuan Stankowski L 02/11/2013 5:02 PM

## 2013-02-11 NOTE — Progress Notes (Signed)
D: Patient in the hallway on approach.  Patient states she had a good day.  Patient states she was upset because she was started on a medication and encouraged by the doctor to take even though she did not want to.  Patient states she did not like the medication made her feel.  Patient states when she took Buspar it made her feel like her head was swimming.  Patient states she is not going to take that medication anymore.  Patient states she is happy that she will be discharged tomorrow.  Patient states she will finally be able to go home to see her children.  Patient denies SI/HI and denies AVH. A: Staff to monitor Q 15 mins for safety.  Encouragement and support offered.  No scheduled medications administered per orders. R: Patient remains safe on the unit.  Patient attended group tonight.  Patient had no medications administered tonight.  Patient visible on the unit and interacting with peers.
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
## 2013-02-11 NOTE — Progress Notes (Signed)
[facility] MD Progress Note  02/11/2013 12:27 PM Paula Colon  MRN:  [id number] Subjective:  Patient was seen and chart reviewed. Patient has been suffering with symptoms of anxiety and was prescribed BuSpar which she has been refusing, thinking that she is going to be sedated. Patient was encouraged to be compliant with her medication and give it try and informed this medication does not make her sedation. Patient is willing to take her medication on contract for safety at this time  Diagnosis:   DSM5: Schizophrenia Disorders:   Obsessive-Compulsive Disorders:   Trauma-Stressor Disorders:   Substance/Addictive Disorders:   Depressive Disorders:  Disruptive Mood Dysregulation Disorder (296.99) and Major Depressive Disorder - Severe (296.23)  Axis I: Bipolar, Depressed, Generalized Anxiety Disorder and Major Depression, Recurrent severe  ADL's:  Impaired  Sleep: Fair  Appetite:  Fair  Suicidal Ideation:  Patient had suicide attempt by overdose of prescription medication but contracts for safety while in hospital Homicidal Ideation:  Denied  AEB (as evidenced by):  Psychiatric Specialty Exam: ROS  Blood pressure 111/76, pulse 96, temperature 98.6 F (37 C), temperature source Oral, resp. rate 16, height 5\' 4"  (1.626 m), weight 78.472 kg (173 lb), last menstrual period 01/18/2013, SpO2 99.00%.Body mass index is 29.68 kg/(m^2).  General Appearance: Casual and Fairly Groomed  Patent attorney::  Fair  Speech:  Negative, Clear and Coherent, Slow and Slurred  Volume:  Normal  Mood:  Anxious  Affect:  Appropriate  Thought Process:  Goal Directed and Intact  Orientation:  Full (Time, Place, and Person)  Thought Content:  Rumination  Suicidal Thoughts:  Yes.  without intent/plan  Homicidal Thoughts:  No  Memory:  Immediate;   Fair  Judgement:  Impaired  Insight:  Fair  Psychomotor Activity:  Normal  Concentration:  Fair  Recall:  NA  Akathisia:  NA  Handed:  Right  AIMS (if indicated):      Assets:  Communication Skills Desire for Improvement Financial Resources/Insurance Housing Intimacy Physical Health Resilience Social Support Transportation  Sleep:  Number of Hours: 6.75   Current Medications: Current Facility-Administered Medications  Medication Dose Route Frequency Provider Last Rate Last Dose  . acetaminophen (TYLENOL) tablet 650 mg  650 mg Oral Q6H PRN Kerry Hough, PA-C      . alum & mag hydroxide-simeth (MAALOX/MYLANTA) 200-200-20 MG/5ML suspension 30 mL  30 mL Oral Q4H PRN Kerry Hough, PA-C      . busPIRone (BUSPAR) tablet 7.5 mg  7.5 mg Oral BID Nehemiah Settle, MD   7.5 mg at 02/11/13 1209  . hydrOXYzine (ATARAX/VISTARIL) tablet 25 mg  25 mg Oral Q6H PRN Beau Fanny, FNP      . magnesium hydroxide (MILK OF MAGNESIA) suspension 30 mL  30 mL Oral Daily PRN Kerry Hough, PA-C      . methocarbamol (ROBAXIN) tablet 500 mg  500 mg Oral Q6H PRN Beau Fanny, FNP      . nicotine (NICODERM CQ - dosed in mg/24 hours) patch 21 mg  21 mg Transdermal Daily Nehemiah Settle, MD   21 mg at 02/11/13 4098  . traZODone (DESYREL) tablet 50 mg  50 mg Oral QHS Nehemiah Settle, MD        Lab Results: No results found for this or any previous visit (from the past 48 hour(s)).  Physical Findings: AIMS:  , ,  ,  ,    CIWA:    COWS:     Treatment Plan Summary:  Daily contact with patient to assess and evaluate symptoms and progress in treatment Medication management  Plan: Treatment Plan/Recommendations:   1. Admit for crisis management and stabilization. 2. Medication management to reduce current symptoms to base line and improve the patient's overall level of functioning. Encouraged compliance with medication management and therapies 3. Treat health problems as indicated. 4. Develop treatment plan to decrease risk of relapse upon discharge and to reduce the need for readmission. 5. Psycho-social education regarding relapse  prevention and self care. 6. Health care follow up as needed for medical problems. 7. Restart home medications where appropriate. 8. Disposition plans are in progress and be discharged tomorrow if continued to contract for safety   Medical Decision Making Problem Points:  Established problem, worsening (2), New problem, with no additional work-up planned (3), Review of last therapy session (1) and Review of psycho-social stressors (1) Data Points:  Review or order clinical lab tests (1) Review or order medicine tests (1) Review of medication regiment & side effects (2) Review of new medications or change in dosage (2)  I certify that inpatient services furnished can reasonably be expected to improve the patient's condition.   Stefany Starace,JANARDHAHA R. 02/11/2013, 12:27 PM

## 2013-02-11 NOTE — Tx Team (Addendum)
Interdisciplinary Treatment Plan Update   Date Reviewed:  02/11/2013  Time Reviewed:  8:41 AM  Progress in Treatment:   Attending groups: Yes Participating in groups: Yes Taking medication as prescribed: Yes  Tolerating medication: Yes Family/Significant other contact made: Yes, contact made with husband  Patient understands diagnosis: Yes  Discussing patient identified problems/goals with staff: Yes Medical problems stabilized or resolved: Yes Denies suicidal/homicidal ideation: Yes Patient has not harmed self or others: Yes  For review of initial/current patient goals, please see plan of care.  Estimated Length of Stay: 1 day - Discharge Thursday  Reasons for Continued Hospitalization:  Anxiety Depression Medication stabilization  New Problems/Goals identified:    Discharge Plan or Barriers:   Home with outpatient follow up [facility]  Additional Comments:  Paula Colon is a 28 y.o. female who presents to the Emergency Department complaining of an overdose that occurred earlier this afternoon. Relative states pt took about 40 Flexeril obtained from his prescription at home. He says he noted slurred speech around 4:30-5 PM, along with strange behavior as the evening progressed. He states he has never suspect any signs of depression or SI in the pt. Denies any other medical problems, and says pt is otherwise healthy. Denies being on any other prescription medications at this time. Pt and husband later told ED staff members that she only took a few pills, maybe 5. [facility] EDP called us to consult about hospitalization and was informed that changing the story does not warrant reconsideration from a behavioral health perspective and that the original documentation of "40" + pills, which include narcotics as well (see [facility] EDP notes) is what we should base our medical reasoning on for treatment. Pt wanted to leave [facility] ED, but EDP completed IVC paperwork and [facility].     Attendees:  Patient:  02/11/2013 8:41 AM   Signature: Mervyn GayJ. Jonnalagadda, MD 02/11/2013 8:41 AM  Signature:  Claudette Headonrad Withrow, NP  02/11/2013 8:41 AM  Signature: Robbie LouisVivian Kent, RN 02/11/2013 8:41 AM  Signature: Dineen KidMichele Johnson, PA Student 02/11/2013 8:41 AM  Signature:   02/11/2013 8:41 AM  Signature:  Juline PatchQuylle Teyonna Plaisted, LCSW 02/11/2013 8:41 AM  Signature:  Reyes Ivanhelsea Horton, LCSW 02/11/2013 8:41 AM  Signature:  Leisa LenzValerie Enoch, Care Coordinator 02/11/2013 8:41 AM  Signature:  Aloha GellKrista Dopson, RN 02/11/2013 8:41 AM  Signature:  02/11/2013  8:41 AM  Signature:   Onnie BoerJennifer Clark, RN [facility] 02/11/2013  8:41 AM  Signature:   02/11/2013  8:41 AM    Scribe for Treatment Team:   Juline PatchQuylle Zebediah Beezley,  02/11/2013 8:41 AM

## 2013-02-11 NOTE — [facility] Group Notes (Signed)
Adult Psychoeducational Group Note  Date:  02/11/2013 Time:  10:40 PM  Group Topic/Focus:  Wrap-Up Group:   The focus of this group is to help patients review their daily goal of treatment and discuss progress on daily workbooks.  Participation Level:  Active  Participation Quality:  Appropriate  Affect:  Appropriate  Cognitive:  Appropriate  Insight: Appropriate  Engagement in Group:  Engaged  Modes of Intervention:  Discussion  Additional Comments:  [name] said her day started off jacked up because the doctor tried to put her on some medication.  She said she ended up taking the medication and felt high.  She also expressed that she is going home tomorrow.  She is looking forward to seeing her babies and having a cigarette.  Paula RancherLindsay, Yasemin Rabon A 02/11/2013, 10:40 PM

## 2013-02-11 NOTE — [facility] Group Notes (Signed)
[facility]   02/11/2013 10:40 AM    Participation Quality:  Appropraite  Mood/Affect:  Appropriate  Depression Rating:  0  Anxiety Rating:  0  Thoughts of Suicide:  No  Will you contract for safety?   NA  Current AVH:  No  Plan for Discharge/Comments:  Patient attended discharge planning group and actively participated in group.  Patient reports doing much better and hopes to discharge home soon.  She will [place] with [facility] [place].   CSW provided all participants with daily workbook.   Transportation Means: Patient has transportation.   Supports:  Patient has a support system.   Jakita Dutkiewicz, Joesph JulyQuylle Hairston

## 2013-02-11 NOTE — Progress Notes (Addendum)
Patient ID: Paula Colon, female   DOB: November 28, 1985, 28 y.o.   MRN: [id number]  D: Pt. Denies SI/HI and A/V Hallucinations. Patient does not report any pain or discomfort at this time. Patient reports 1/10 depression and hopelessness today. This morning patient refused her Buspar but after speaking to the MD she was compliant with her medication. Patient requested an information sheet on Buspar.   A: Support and encouragement provided to the patient to take medications and go to groups. Patient was given scheduled meds per physician's orders/recommendation. Writer gave patient medication information sheet on Buspar. Patient had no questions or concerns.  R: Patient was irritable this morning but later in the day she was receptive and cooperative but minimal. Q15 minute checks are maintained for safety.

## 2013-02-12 MED ORDER — BUSPIRONE HCL 7.5 MG PO TABS: 7.5000 mg | ORAL_TABLET | Freq: Two times a day (BID) | ORAL | Status: DC

## 2013-02-12 NOTE — [facility] Group Notes (Signed)
[facility] LCSW Group Therapy  02/12/2013  1:15 PM   Type of Therapy:  Group Therapy  Participation Level:  Active  Participation Quality:  Attentive, Sharing and Supportive  Affect:  Depressed and Flat  Cognitive:  Alert and Oriented  Insight:  Developing/Improving and Engaged  Engagement in Therapy:  Developing/Improving and Engaged  Modes of Intervention:  Activity, Clarification, Confrontation, Discussion, Education, Exploration, Limit-setting, Orientation, Problem-solving, Rapport Building, Reality Testing, Socialization and Support  Summary of Progress/Problems: Patient was attentive and engaged with speaker from [employer].  Patient was attentive to speaker while they shared their story of dealing with mental health and overcoming it.  Patient expressed interest in their programs and services and received information on their agency.  Patient processed ways they can relate to the speaker.     Paula Rosiles Horton, LCSW 02/12/2013 1:43 PM   

## 2013-02-12 NOTE — Progress Notes (Signed)
Adult Psychoeducational Group Note  Date:  02/12/2013 Time:  5:05 PM  Group Topic/Focus:  Rediscovering Joy:   The focus of this group is to explore various ways to relieve stress in a positive manner and how to rediscover joy and laughter in their life.  Participation Level:  Active  Participation Quality:  Appropriate  Affect:  Appropriate  Cognitive:  Appropriate  Insight: Appropriate  Engagement in Group:  Engaged  Modes of Intervention:  Discussion and Support  Additional Comments:  Pts discussed things that bring them joy and make them laugh. Pt stated she enjoys reading poems and writing poetry.  Caswell CorwinOwen, Shayley Medlin C 02/12/2013, 5:05 PM

## 2013-02-12 NOTE — Progress Notes (Signed)
The focus of this group is to educate the patient on the purpose and policies of crisis stabilization and provide a format to answer questions about their admission.  The group details unit policies and expectations of patients while admitted.  Patient attended 0900 nurse education orientation group this morning.  Patient listened, appropriate affect, alert, appropriate insight and engagement.  Today patient will work on 3 goals for discharge.  

## 2013-02-12 NOTE — Progress Notes (Signed)
Discharge Note: Discharge instructions/prescriptions/medication samples given to patient. Patient verbalized understanding of discharge instructions and prescriptions. Returned belongings to patient. Denies SI/HI/AVH. Patient d/c without incident to the lobby and transported home by husband. 

## 2013-02-12 NOTE — Discharge Summary (Signed)
Physician Discharge Summary Note  Patient:  Paula Colon is an 28 y.o., female MRN:  [id number] DOB:  1985/06/22 Patient phone:  [phone] (home)  Patient address:   [phone] [place] [address],   Date of Admission:  02/09/2013 Date of Discharge: 02/12/2013  Reason for Admission:  Suicidal Ideation/Attempt (OD)  Discharge Diagnoses: Active Problems:   Depression   Acute encephalopathy   Bipolar 1 disorder with moderate mania  Review of Systems  Constitutional: Negative.   HENT: Negative.   Eyes: Negative.   Respiratory: Negative.   Cardiovascular: Negative.   Gastrointestinal: Negative.   Genitourinary: Negative.   Musculoskeletal: Negative.   Skin: Negative.   Neurological: Negative.   Endo/Heme/Allergies: Negative.   Psychiatric/Behavioral: Negative.     DSM5:  Schizophrenia Disorders:  Schizophrenia (295.7) Trauma-Stressor Disorders:  N/A Substance/Addictive Disorders:  N/A Depressive Disorders:  Major Depressive Disorder - Moderate (296.22)  Axis Diagnosis:   AXIS I:  Major Depression, Recurrent severe AXIS II:  Deferred AXIS III:   Past Medical History  Diagnosis Date  . Asthma     as a child  . PTSD (post-traumatic stress disorder)    AXIS IV:  other psychosocial or environmental problems and problems related to social environment AXIS V:  61-70 mild symptoms  Level of Care:  OP  Hospital Course:   Paula Colon is a 28 y.o. female who presents to the Emergency Department complaining of an overdose that occurred earlier this afternoon. Relative states pt took about 40 Flexeril obtained from his prescription at home. He says he noted slurred speech around 4:30-5 PM, along with strange behavior as the evening progressed. He states he has never suspect any signs of depression or SI in the pt. Denies any other medical problems, and says pt is otherwise healthy. Denies being on any other prescription medications at this time. Pt and husband  later told ED staff members that she only took a few pills, maybe 5. [facility] EDP called us to consult about hospitalization and was informed that changing the story does not warrant reconsideration from a behavioral health perspective and that the original documentation of "40" + pills, which include narcotics as well (see [facility] EDP notes) is what we should base our medical reasoning on for treatment. Pt wanted to leave [facility] ED, but EDP completed IVC paperwork and [facility].   As of 02/10/2013, pt denies anxiety, depression, SI, HI, and AVH. Pt reports that she does not remember the event very well and that she only took a few pills. Pt continues to state that she does not need to be here and that she wants to go home to her husband and 2 children. Patient rates depression at 0/10 and anxiety at 0/10, denying any psychological symptoms. Pt does report chronic LBP following her epidural procedures for childbirth and reports that this is why she took her husband's flexeril, because her "back spasms really bad."   During Hospitalization: Medications managed, psychoeducation, group and individual therapy. Pt currently denies SI, HI, and Psychosis. At discharge, pt rates anxiety at 0/10 and depression at 0/10. Pt states that she does have a good supportive home environment and will followup with outpatient treatment. Affirms agreement with discharge plan, although pt has been refusing all medications inpatient (except Nic patch). Denies other physical and psychological concerns at time of discharge.    Consults:  None  Significant Diagnostic Studies:  None  Discharge Vitals:   Blood  pressure 114/68, pulse 105, temperature 97.7 F (36.5 C), temperature source Oral, resp. rate 16, height 5\' 4"  (1.626 m), weight 78.472 kg (173 lb), last menstrual period 01/18/2013, SpO2 99.00%. Body mass index is 29.68 kg/(m^2). Lab Results:   No results found for this or any previous visit (from the  past 72 hour(s)).  Physical Findings: AIMS:  , ,  ,  ,    CIWA:    COWS:     Psychiatric Specialty Exam: See Psychiatric Specialty Exam and Suicide Risk Assessment completed by Attending Physician prior to discharge.  Discharge destination:  Home  Is patient on multiple antipsychotic therapies at discharge:  No   Has Patient had three or more failed trials of antipsychotic monotherapy by history:  No  Recommended Plan for Multiple Antipsychotic Therapies: NA     Medication List       Indication   busPIRone 7.5 MG tablet  Commonly known as:  BUSPAR  Take 1 tablet (7.5 mg total) by mouth 2 (two) times daily.   Indication:  mood stabilization           Follow-up Information   Follow up with [facility] On 02/17/2013. (Please go to [facility] walk in clinic on Tuesday, February 17, 2013 or any weekday between 8AM-5PM for medication managment and counseling)    Contact information:   [address] [place], [place]  [address]  [phone]      Follow-up recommendations:  Activity:  As tolerated Diet:  Heart healthy with low sodium.  Comments:   Take all medications as prescribed. Keep all follow-up appointments as scheduled.  Do not consume alcohol or use illegal drugs while on prescription medications. Report any adverse effects from your medications to your primary care provider promptly.  In the event of recurrent symptoms or worsening symptoms, call 911, a crisis hotline, or go to the nearest emergency department for evaluation.   Total Discharge Time:  Greater than 30 minutes.  Signed: Beau Fanny, FNP-BC 02/12/2013, 10:36 AM  Patient was seen face-to-face psychiatric evaluation and suicide risk assessment. Case was discussed with the physician extender and formulated the treatment plan. Reviewed the information documented by physician extender and agree with the discharged plan.  Amel Gianino,JANARDHAHA R. 02/13/2013 6:41 PM

## 2013-02-12 NOTE — [facility] Suicide Risk Assessment (Signed)
Suicide Risk Assessment  Discharge Assessment     Demographic Factors:  Adolescent or young adult, Caucasian, Low socioeconomic status and Unemployed  Mental Status Per Nursing Assessment::   On Admission:  NA  Current Mental Status by Physician: Patient was calm and cooperative. Patient has a normal psychomotor activity. Patient has a good mood and appropriate affect. Patient has normal speech and thought process. Patient has no reported suicidal or homicidal ideations, intentions or plan. Patient has no evidence of psychotic symptoms  Loss Factors: Financial problems/change in socioeconomic status  Historical Factors: Prior suicide attempts, Family history of mental illness or substance abuse and Impulsivity  Risk Reduction Factors:   Sense of responsibility to family, Religious beliefs about death, Living with another person, especially a relative, Positive social support, Positive therapeutic relationship and Positive coping skills or problem solving skills  Continued Clinical Symptoms:  Depression:   Recent sense of peace/wellbeing More than one psychiatric diagnosis Previous Psychiatric Diagnoses and Treatments Medical Diagnoses and Treatments/Surgeries  Cognitive Features That Contribute To Risk:  Polarized thinking    Suicide Risk:  Minimal: No identifiable suicidal ideation.  Patients presenting with no risk factors but with morbid ruminations; may be classified as minimal risk based on the severity of the depressive symptoms  Discharge Diagnoses:   AXIS I:  Bipolar, Depressed AXIS II:  Deferred AXIS III:   Past Medical History  Diagnosis Date  . Asthma     as a child  . PTSD (post-traumatic stress disorder)    AXIS IV:  economic problems, occupational problems, other psychosocial or environmental problems, problems related to social environment and problems with primary support group AXIS V:  61-70 mild symptoms  Plan Of Care/Follow-up recommendations:   Activity:  As tolerated Diet:  Review of  Is patient on multiple antipsychotic therapies at discharge:  No   Has Patient had three or more failed trials of antipsychotic monotherapy by history:  No  Recommended Plan for Multiple Antipsychotic Therapies: NA  Christianne Zacher,JANARDHAHA R. 02/12/2013, 11:36 AM

## 2013-02-17 NOTE — Progress Notes (Signed)
Patient Discharge Instructions:  After Visit Summary (AVS):   Faxed to:  02/17/13 Discharge Summary Note:   Faxed to:  02/17/13 Psychiatric Admission Assessment Note:   Faxed to:  02/17/13 Suicide Risk Assessment - Discharge Assessment:   Faxed to:  02/17/13 Faxed/Sent to the Next Level Care provider:  02/17/13 Faxed to [facility] @ [phone]  Jerelene ReddenSheena E [place], 02/17/2013, 3:40 PM

## 2013-02-24 ENCOUNTER — Emergency Department (HOSPITAL_COMMUNITY)
Admission: EM | Admit: 2013-02-24 | Discharge: 2013-02-24 | Discharge status: Against Medical Advice | Payer: Medicaid Other | Attending: Emergency Medicine | Admitting: Emergency Medicine

## 2013-02-24 DIAGNOSIS — F17200 Nicotine dependence, unspecified, uncomplicated: Secondary | ICD-10-CM | POA: Insufficient documentation

## 2013-02-24 DIAGNOSIS — J45909 Unspecified asthma, uncomplicated: Secondary | ICD-10-CM | POA: Insufficient documentation

## 2013-02-24 DIAGNOSIS — R110 Nausea: Secondary | ICD-10-CM | POA: Insufficient documentation

## 2013-02-24 DIAGNOSIS — R109 Unspecified abdominal pain: Secondary | ICD-10-CM | POA: Insufficient documentation

## 2013-02-25 ENCOUNTER — Encounter (HOSPITAL_COMMUNITY): Payer: Self-pay | Admitting: Emergency Medicine

## 2013-02-25 ENCOUNTER — Emergency Department (HOSPITAL_COMMUNITY): Payer: Medicaid Other

## 2013-02-25 ENCOUNTER — Emergency Department (HOSPITAL_COMMUNITY)
Admission: EM | Admit: 2013-02-25 | Discharge: 2013-02-26 | Disposition: A | Discharge status: Home / Self Care | Payer: Medicaid Other | Attending: Emergency Medicine | Admitting: Emergency Medicine

## 2013-02-25 DIAGNOSIS — Z880 Allergy status to penicillin: Secondary | ICD-10-CM | POA: Insufficient documentation

## 2013-02-25 DIAGNOSIS — J45909 Unspecified asthma, uncomplicated: Secondary | ICD-10-CM | POA: Insufficient documentation

## 2013-02-25 DIAGNOSIS — N926 Irregular menstruation, unspecified: Secondary | ICD-10-CM | POA: Insufficient documentation

## 2013-02-25 DIAGNOSIS — Z8659 Personal history of other mental and behavioral disorders: Secondary | ICD-10-CM | POA: Insufficient documentation

## 2013-02-25 DIAGNOSIS — Z9851 Tubal ligation status: Secondary | ICD-10-CM | POA: Insufficient documentation

## 2013-02-25 DIAGNOSIS — F17200 Nicotine dependence, unspecified, uncomplicated: Secondary | ICD-10-CM | POA: Insufficient documentation

## 2013-02-25 DIAGNOSIS — R109 Unspecified abdominal pain: Secondary | ICD-10-CM | POA: Insufficient documentation

## 2013-02-25 DIAGNOSIS — Z3202 Encounter for pregnancy test, result negative: Secondary | ICD-10-CM | POA: Insufficient documentation

## 2013-02-25 LAB — URINALYSIS, ROUTINE W REFLEX MICROSCOPIC
Bilirubin Urine: NEGATIVE
GLUCOSE, UA: NEGATIVE mg/dL
HGB URINE DIPSTICK: NEGATIVE
Ketones, ur: NEGATIVE mg/dL
LEUKOCYTES UA: NEGATIVE
Nitrite: NEGATIVE
PH: 5.5 (ref 5.0–8.0)
Protein, ur: NEGATIVE mg/dL
Specific Gravity, Urine: >1.03 — ABNORMAL HIGH (ref 1.005–1.030)
Urobilinogen, UA: 0.2 mg/dL (ref 0.0–1.0)

## 2013-02-25 LAB — CBC WITH DIFFERENTIAL/PLATELET
BASOS ABS: 0 10*3/uL (ref 0.0–0.1)
Basophils Relative: 0 % (ref 0–1)
EOS PCT: 2 % (ref 0–5)
Eosinophils Absolute: 0.1 10*3/uL (ref 0.0–0.7)
HCT: 46.8 % — ABNORMAL HIGH (ref 36.0–46.0)
Hemoglobin: 16.7 g/dL — ABNORMAL HIGH (ref 12.0–15.0)
LYMPHS PCT: 28 % (ref 12–46)
Lymphs Abs: 1.7 10*3/uL (ref 0.7–4.0)
MCH: 30.4 pg (ref 26.0–34.0)
MCHC: 35.7 g/dL (ref 30.0–36.0)
MCV: 85.2 fL (ref 78.0–100.0)
Monocytes Absolute: 0.6 10*3/uL (ref 0.1–1.0)
Monocytes Relative: 9 % (ref 3–12)
Neutro Abs: 3.6 10*3/uL (ref 1.7–7.7)
Neutrophils Relative %: 61 % (ref 43–77)
PLATELETS: 202 10*3/uL (ref 150–400)
RBC: 5.49 MIL/uL — AB (ref 3.87–5.11)
RDW: 12.3 % (ref 11.5–15.5)
WBC: 6 10*3/uL (ref 4.0–10.5)

## 2013-02-25 LAB — COMPREHENSIVE METABOLIC PANEL
ALT: 10 U/L (ref 0–35)
AST: 15 U/L (ref 0–37)
Albumin: 4.4 g/dL (ref 3.5–5.2)
Alkaline Phosphatase: 89 U/L (ref 39–117)
BUN: 10 mg/dL (ref 6–23)
CALCIUM: 9.6 mg/dL (ref 8.4–10.5)
CHLORIDE: 98 meq/L (ref 96–112)
CO2: 28 mEq/L (ref 19–32)
Creatinine, Ser: 0.85 mg/dL (ref 0.50–1.10)
GFR calc Af Amer: >90 mL/min (ref >90)
GFR calc non Af Amer: >90 mL/min (ref >90)
Glucose, Bld: 89 mg/dL (ref 70–99)
POTASSIUM: 3.9 meq/L (ref 3.7–5.3)
Sodium: 138 mEq/L (ref 137–147)
Total Bilirubin: 0.6 mg/dL (ref 0.3–1.2)
Total Protein: 8.1 g/dL (ref 6.0–8.3)

## 2013-02-25 LAB — LIPASE, BLOOD: Lipase: 16 U/L (ref 11–59)

## 2013-02-25 LAB — PREGNANCY, URINE: PREG TEST UR: NEGATIVE

## 2013-02-25 MED ORDER — BARIUM SULFATE 2.1 % PO SUSP: 900.0000 mL | Freq: Once | ORAL | Status: DC

## 2013-02-25 MED ORDER — SODIUM CHLORIDE 0.9 % IV BOLUS (SEPSIS)
1000.0000 mL | Freq: Once | INTRAVENOUS | Status: AC
  Administered 2013-02-25: 1000 mL via INTRAVENOUS

## 2013-02-25 NOTE — ED Provider Notes (Signed)
CSN: [id number]     Arrival date & time 02/25/13  1909 History   First MD Initiated Contact with Patient 02/25/13 2024     Chief Complaint  Patient presents with  . Abdominal Pain   (Consider location/radiation/quality/duration/timing/severity/associated sxs/prior Treatment) HPI 28 year-old status post bilateral tubal ligation who comes in today complaining of one-week history of abdominal pain. She states that it worsens with by mouth intake. She denies vomiting. She states it has gradually worsened over the week. The pain is located more to the right side of her abdomen. She points to the epigastrium when asked to point where it hurts. She describes the pain is painful in nature. She's says it has been gradually increasing and worsens with food. There are no relieving factors. She has not had any associated symptoms of fever or, diarrhea, abnormal vaginal discharge, or urinary tract infection symptoms. She has not had any similar symptoms in the past. She states that her periods are irregular she is unable to tell me when she last had a menstrual cycle.   Past Medical History  Diagnosis Date  . Asthma     as a child  . PTSD (post-traumatic stress disorder)    Past Surgical History  Procedure Laterality Date  . Dilation and curettage of uterus    . Tubal ligation     History reviewed. No pertinent family history. History  Substance Use Topics  . Smoking status: Current Every Day Smoker -- 2.00 packs/day    Types: Cigarettes  . Smokeless tobacco: Not on file  . Alcohol Use: No   OB History   Grav Para Term Preterm Abortions TAB SAB Ect Mult Living                 Review of Systems  All other systems reviewed and are negative.    Allergies  Bee venom; Mushroom extract complex; Shellfish allergy; Abilify; Ivp dye; Penicillins; and Seroquel  Home Medications  No current outpatient prescriptions on file. BP 129/86  Pulse 90  Temp(Src) 97.8 F (36.6 C)  Resp 20  Ht 5\' 3"   (1.6 m)  Wt 170 lb (77.111 kg)  BMI 30.12 kg/m2  SpO2 100%  LMP 01/18/2013 Physical Exam  Nursing note and vitals reviewed. Constitutional: She is oriented to person, place, and time. She appears well-developed and well-nourished.  HENT:  Head: Normocephalic and atraumatic.  Right Ear: External ear normal.  Left Ear: External ear normal.  Nose: Nose normal.  Mouth/Throat: Oropharynx is clear and moist.  Eyes: Conjunctivae and EOM are normal. Pupils are equal, round, and reactive to light.  Neck: Normal range of motion. Neck supple.  Cardiovascular: Normal rate, regular rhythm, normal heart sounds and intact distal pulses.   Pulmonary/Chest: Effort normal and breath sounds normal.  Abdominal: Soft. Bowel sounds are normal. She exhibits no mass. There is tenderness. There is no rebound and no guarding.  Epigastric, right upper quadrant, and right lower quadrant tenderness to palpation.  Genitourinary: Vagina normal.  Uterine tenderness to palpation with some fullness noted on the right. There is some discharge noted in the vaginal vault but no significant cervical motion tenderness the  Musculoskeletal: Normal range of motion.  Neurological: She is alert and oriented to person, place, and time. She has normal reflexes. No cranial nerve deficit. She exhibits normal muscle tone. Coordination normal.  Skin: Skin is warm and dry.  Psychiatric: She has a normal mood and affect. Her behavior is normal. Judgment and thought content normal.  ED Course  Procedures (including critical care time) Labs Review Labs Reviewed  CBC WITH DIFFERENTIAL - Abnormal; Notable for the following:    RBC 5.49 (*)    Hemoglobin 16.7 (*)    HCT 46.8 (*)    All other components within normal limits  GC/CHLAMYDIA PROBE AMP  URINALYSIS, ROUTINE W REFLEX MICROSCOPIC  PREGNANCY, URINE  COMPREHENSIVE METABOLIC PANEL  LIPASE, BLOOD   Imaging Review Ct Abdomen Pelvis Wo Contrast  02/25/2013   CLINICAL DATA:   Abdominal pain  EXAM: CT ABDOMEN AND PELVIS WITHOUT CONTRAST  TECHNIQUE: Multidetector CT imaging of the abdomen and pelvis was performed following the standard protocol without IV contrast.  COMPARISON:  None.  FINDINGS: Lung bases clear. Normal heart size. No pericardial or pleural effusion.  Abdomen: Liver, collapsed gallbladder, biliary system, pancreas, spleen, adrenal glands, and kidneys are within normal limits for age and demonstrate no acute finding by noncontrast imaging.  Kidneys demonstrate no hydronephrosis or obstruction. No obstructing ureteral calculus on either side.  No abdominal free fluid, fluid collection, hemorrhage, abscess, or adenopathy.  Negative for bowel obstruction, dilatation, ileus, or free air.  Normal appendix.  Pelvis: Mildly prominent ovaries. No definite adnexal abnormality. Uterus normal in size. Urinary bladder is collapsed. No pelvic free fluid, fluid collection, hemorrhage, abscess, or adenopathy. No inguinal abnormality, or hernia.  No acute osseous finding.  IMPRESSION: No acute intra-abdominal or pelvic finding by noncontrast CT.  No acute obstructing ureteral calculus or hydronephrosis  Normal appendix   Electronically Signed   By: Ruel Favorsrevor  Shick M.D.   On: 02/25/2013 23:44    EKG Interpretation   None       MDM  No diagnosis found. Abdominal pain with diffuse ttp although greater on left than right.  Labs normal.  CT pending.    Hilario Quarryanielle S Alejah Aristizabal, MD 02/26/13 [phone]

## 2013-02-25 NOTE — ED Notes (Signed)
PT C/O ABDOMINAL PAIN X 1 WK, SOB, AND STATES HER TOES ARE BLUE.

## 2013-02-26 MED ORDER — OMEPRAZOLE 20 MG PO CPDR: 20.0000 mg | DELAYED_RELEASE_CAPSULE | Freq: Every day | ORAL | Status: DC

## 2013-02-26 NOTE — Discharge Instructions (Signed)
Abdominal Pain, Women °Abdominal (stomach, pelvic, or belly) pain can be caused by many things. It is important to tell your doctor: °· The location of the pain. °· Does it come and go or is it present all the time? °· Are there things that start the pain (eating certain foods, exercise)? °· Are there other symptoms associated with the pain (fever, nausea, vomiting, diarrhea)? °All of this is helpful to know when trying to find the cause of the pain. °CAUSES  °· Stomach: virus or bacteria infection, or ulcer. °· Intestine: appendicitis (inflamed appendix), regional ileitis (Crohn's disease), ulcerative colitis (inflamed colon), irritable bowel syndrome, diverticulitis (inflamed diverticulum of the colon), or cancer of the stomach or intestine. °· Gallbladder disease or stones in the gallbladder. °· Kidney disease, kidney stones, or infection. °· Pancreas infection or cancer. °· Fibromyalgia (pain disorder). °· Diseases of the female organs: °· Uterus: fibroid (non-cancerous) tumors or infection. °· Fallopian tubes: infection or tubal pregnancy. °· Ovary: cysts or tumors. °· Pelvic adhesions (scar tissue). °· Endometriosis (uterus lining tissue growing in the pelvis and on the pelvic organs). °· Pelvic congestion syndrome (female organs filling up with blood just before the menstrual period). °· Pain with the menstrual period. °· Pain with ovulation (producing an egg). °· Pain with an IUD (intrauterine device, birth control) in the uterus. °· Cancer of the female organs. °· Functional pain (pain not caused by a disease, may improve without treatment). °· Psychological pain. °· Depression. °DIAGNOSIS  °Your doctor will decide the seriousness of your pain by doing an examination. °· Blood tests. °· X-rays. °· Ultrasound. °· CT scan (computed tomography, special type of X-ray). °· MRI (magnetic resonance imaging). °· Cultures, for infection. °· Barium enema (dye inserted in the large intestine, to better view it with  X-rays). °· Colonoscopy (looking in intestine with a lighted tube). °· Laparoscopy (minor surgery, looking in abdomen with a lighted tube). °· Major abdominal exploratory surgery (looking in abdomen with a large incision). °TREATMENT  °The treatment will depend on the cause of the pain.  °· Many cases can be observed and treated at home. °· Over-the-counter medicines recommended by your caregiver. °· Prescription medicine. °· Antibiotics, for infection. °· Birth control pills, for painful periods or for ovulation pain. °· Hormone treatment, for endometriosis. °· Nerve blocking injections. °· Physical therapy. °· Antidepressants. °· Counseling with a psychologist or psychiatrist. °· Minor or major surgery. °HOME CARE INSTRUCTIONS  °· Do not take laxatives, unless directed by your caregiver. °· Take over-the-counter pain medicine only if ordered by your caregiver. Do not take aspirin because it can cause an upset stomach or bleeding. °· Try a clear liquid diet (broth or water) as ordered by your caregiver. Slowly move to a bland diet, as tolerated, if the pain is related to the stomach or intestine. °· Have a thermometer and take your temperature several times a day, and record it. °· Bed rest and sleep, if it helps the pain. °· Avoid sexual intercourse, if it causes pain. °· Avoid stressful situations. °· Keep your follow-up appointments and tests, as your caregiver orders. °· If the pain does not go away with medicine or surgery, you may try: °· Acupuncture. °· Relaxation exercises (yoga, meditation). °· Group therapy. °· Counseling. °SEEK MEDICAL CARE IF:  °· You notice certain foods cause stomach pain. °· Your home care treatment is not helping your pain. °· You need stronger pain medicine. °· You want your IUD removed. °· You feel faint or   lightheaded. °· You develop nausea and vomiting. °· You develop a rash. °· You are having side effects or an allergy to your medicine. °SEEK IMMEDIATE MEDICAL CARE IF:  °· Your  pain does not go away or gets worse. °· You have a fever. °· Your pain is felt only in portions of the abdomen. The right side could possibly be appendicitis. The left lower portion of the abdomen could be colitis or diverticulitis. °· You are passing blood in your stools (bright red or black tarry stools, with or without vomiting). °· You have blood in your urine. °· You develop chills, with or without a fever. °· You pass out. °MAKE SURE YOU:  °· Understand these instructions. °· Will watch your condition. °· Will get help right away if you are not doing well or get worse. °Document Released: 11/12/2006 Document Revised: 04/09/2011 Document Reviewed: 12/02/2008 °[employer]® Patient Information ©2014 [employer]. ° °

## 2013-02-26 NOTE — ED Provider Notes (Signed)
I discussed CT results with the patient.  She has no new complaints, appears in no distress. She voiced understanding of the need for primary care followup  Gerhard Munchobert Homer Pfeifer, MD 02/26/13 [phone]

## 2013-02-27 LAB — GC/CHLAMYDIA PROBE AMP
CT Probe RNA: NEGATIVE
GC PROBE AMP APTIMA: NEGATIVE

## 2013-10-11 ENCOUNTER — Emergency Department (HOSPITAL_COMMUNITY)
Admission: EM | Admit: 2013-10-11 | Discharge: 2013-10-11 | Disposition: A | Discharge status: Home / Self Care | Payer: Medicaid Other | Attending: Emergency Medicine | Admitting: Emergency Medicine

## 2013-10-11 ENCOUNTER — Encounter (HOSPITAL_COMMUNITY): Payer: Self-pay | Admitting: Emergency Medicine

## 2013-10-11 DIAGNOSIS — Z87891 Personal history of nicotine dependence: Secondary | ICD-10-CM | POA: Diagnosis not present

## 2013-10-11 DIAGNOSIS — J45909 Unspecified asthma, uncomplicated: Secondary | ICD-10-CM | POA: Insufficient documentation

## 2013-10-11 DIAGNOSIS — Z8659 Personal history of other mental and behavioral disorders: Secondary | ICD-10-CM | POA: Diagnosis not present

## 2013-10-11 DIAGNOSIS — Z880 Allergy status to penicillin: Secondary | ICD-10-CM | POA: Insufficient documentation

## 2013-10-11 DIAGNOSIS — J069 Acute upper respiratory infection, unspecified: Secondary | ICD-10-CM | POA: Insufficient documentation

## 2013-10-11 DIAGNOSIS — J3489 Other specified disorders of nose and nasal sinuses: Secondary | ICD-10-CM | POA: Diagnosis present

## 2013-10-11 MED ORDER — CETIRIZINE-PSEUDOEPHEDRINE ER 5-120 MG PO TB12
1.0000 | ORAL_TABLET | Freq: Every day | ORAL | Status: DC
Start: 2013-10-11 — End: 2013-10-11

## 2013-10-11 MED ORDER — BENZONATATE 200 MG PO CAPS: 200.0000 mg | ORAL_CAPSULE | Freq: Three times a day (TID) | ORAL | Status: DC

## 2013-10-11 MED ORDER — BENZONATATE 100 MG PO CAPS
200.0000 mg | ORAL_CAPSULE | Freq: Once | ORAL | Status: AC
  Administered 2013-10-11: 200 mg via ORAL
  Filled 2013-10-11: qty 2

## 2013-10-11 MED ORDER — CETIRIZINE-PSEUDOEPHEDRINE ER 5-120 MG PO TB12: 1.0000 | ORAL_TABLET | Freq: Two times a day (BID) | ORAL | Status: DC

## 2013-10-11 NOTE — ED Provider Notes (Signed)
CSN: [id number]     Arrival date & time 10/11/13  1033 History  This chart was scribed for non-physician practitioner, Burgess Amor, PA-C,working with Raeford Razor, MD, by Karle Plumber, ED Scribe. This patient was seen in room APFT24/APFT24 and the patient's care was started at 11:34 AM.  Chief Complaint  Patient presents with  . Nasal Congestion   The history is provided by the patient. No language interpreter was used.   HPI Comments:  Paula Colon is a 28 y.o. female with PMH of asthma and PTSD who presents to the Emergency Department complaining of congestion that started approximately four days ago. Pt states she feels as if her ears are stopped up bilaterally and feels pressure in each, with the right being the worst.. She reports associated intermittent rhinorrhea, sore throat and cough. She has not taken anything for her symptoms. She denies fever, facial pain, nausea or vomiting. She reports she is a former smoker.  Her 59 and 28 year old daughers are also here to be evaluated for similar symptoms.  Past Medical History  Diagnosis Date  . Asthma     as a child  . PTSD (post-traumatic stress disorder)    Past Surgical History  Procedure Laterality Date  . Dilation and curettage of uterus    . Tubal ligation     No family history on file. History  Substance Use Topics  . Smoking status: Former Smoker -- 2.00 packs/day    Quit date: 02/10/2013  . Smokeless tobacco: Not on file  . Alcohol Use: No   OB History   Grav Para Term Preterm Abortions TAB SAB Ect Mult Living                 Review of Systems  Constitutional: Negative for fever.  HENT: Positive for congestion and sore throat. Negative for ear pain, sinus pressure, trouble swallowing and voice change.   Eyes: Negative for discharge.  Respiratory: Positive for cough. Negative for shortness of breath, wheezing and stridor.   Cardiovascular: Negative for chest pain.  Gastrointestinal: Negative for nausea, vomiting  and abdominal pain.  Genitourinary: Negative.     Allergies  Bee venom; Mushroom extract complex; Shellfish allergy; Abilify; Ivp dye; Penicillins; and Seroquel  Home Medications   Prior to Admission medications   Medication Sig Start Date End Date Taking? Authorizing Provider  benzonatate (TESSALON) 200 MG capsule Take 1 capsule (200 mg total) by mouth every 8 (eight) hours. 10/11/13   Burgess Amor, PA-C  cetirizine-pseudoephedrine (ZYRTEC-D) 5-120 MG per tablet Take 1 tablet by mouth 2 (two) times daily. 10/11/13   Burgess Amor, PA-C   Triage Vitals: BP 125/75  Pulse 77  Temp(Src) 99.1 F (37.3 C) (Oral)  Resp 16  Ht  (1.6 m)  Wt 180 lb (81.647 kg)  BMI 31.89 kg/m2  SpO2 99%  LMP 09/27/2013 Physical Exam  Nursing note and vitals reviewed. Constitutional: She is oriented to person, place, and time. She appears well-developed and well-nourished.  HENT:  Head: Normocephalic and atraumatic.  Right Ear: Ear canal normal. Tympanic membrane is erythematous and bulging.  Left Ear: Tympanic membrane and ear canal normal.  Nose: Mucosal edema and rhinorrhea present.  Mouth/Throat: Uvula is midline, oropharynx is clear and moist and mucous membranes are normal. No oropharyngeal exudate, posterior oropharyngeal edema, posterior oropharyngeal erythema or tonsillar abscesses.   mild erythema around the right TM. Landmarks present.  Eyes: Conjunctivae are normal.  Cardiovascular: Normal rate, regular rhythm and normal heart  sounds.  Exam reveals no gallop and no friction rub.   No murmur heard. Pulmonary/Chest: Effort normal and breath sounds normal. No respiratory distress. She has no decreased breath sounds. She has no wheezes. She has no rhonchi. She has no rales.  Abdominal: There is no tenderness.  Musculoskeletal: Normal range of motion.  Neurological: She is alert and oriented to person, place, and time.  Skin: Skin is warm and dry. No rash noted.  Psychiatric: She has a normal  mood and affect.    ED Course  Procedures (including critical care time) DIAGNOSTIC STUDIES: Oxygen Saturation is 99% on RA, normal by my interpretation.   COORDINATION OF CARE: 11:38 AM- Will prescribe decongestant and informed pt to get plenty of rest and drink plenty of fluids. Will prescribe tessalon perles. Pt verbalizes understanding and agrees to plan.  Medications  benzonatate (TESSALON) capsule 200 mg (200 mg Oral Given 10/11/13 1158)    Labs Review Labs Reviewed - No data to display  Imaging Review No results found.   EKG Interpretation None      MDM   Final diagnoses:  Acute URI    Pt with acute uri and nasal congestion/eustachian dysfunction. No exam findings suggesting acute otitis media.  She was prescribed tessalon and zyrtec d for cough and congestion sx.  Encouraged rest, tylenol or motrin for pain relief. Increased fluid intake. Recheck by pcp if sx not improved over the next week.  I personally performed the services described in this documentation, which was scribed in my presence. The recorded information has been reviewed and is accurate.    Burgess Amor, PA-C 10/13/13 2254

## 2013-10-11 NOTE — Discharge Instructions (Signed)
Upper Respiratory Infection, Adult An upper respiratory infection (URI) is also sometimes known as the common cold. The upper respiratory tract includes the nose, sinuses, throat, trachea, and bronchi. Bronchi are the airways leading to the lungs. Most people improve within 1 week, but symptoms can last up to 2 weeks. A residual cough may last even longer.  CAUSES Many different viruses can infect the tissues lining the upper respiratory tract. The tissues become irritated and inflamed and often become very moist. Mucus production is also common. A cold is contagious. You can easily spread the virus to others by oral contact. This includes kissing, sharing a glass, coughing, or sneezing. Touching your mouth or nose and then touching a surface, which is then touched by another person, can also spread the virus. SYMPTOMS  Symptoms typically develop 1 to 3 days after you come in contact with a cold virus. Symptoms vary from person to person. They may include:  Runny nose.  Sneezing.  Nasal congestion.  Sinus irritation.  Sore throat.  Loss of voice (laryngitis).  Cough.  Fatigue.  Muscle aches.  Loss of appetite.  Headache.  Low-grade fever. DIAGNOSIS  You might diagnose your own cold based on familiar symptoms, since most people get a cold 2 to 3 times a year. Your caregiver can confirm this based on your exam. Most importantly, your caregiver can check that your symptoms are not due to another disease such as strep throat, sinusitis, pneumonia, asthma, or epiglottitis. Blood tests, throat tests, and X-rays are not necessary to diagnose a common cold, but they may sometimes be helpful in excluding other more serious diseases. Your caregiver will decide if any further tests are required. RISKS AND COMPLICATIONS  You may be at risk for a more severe case of the common cold if you smoke cigarettes, have chronic heart disease (such as heart failure) or lung disease (such as asthma), or if  you have a weakened immune system. The very young and very old are also at risk for more serious infections. Bacterial sinusitis, middle ear infections, and bacterial pneumonia can complicate the common cold. The common cold can worsen asthma and chronic obstructive pulmonary disease (COPD). Sometimes, these complications can require emergency medical care and may be life-threatening. PREVENTION  The best way to protect against getting a cold is to practice good hygiene. Avoid oral or hand contact with people with cold symptoms. Wash your hands often if contact occurs. There is no clear evidence that vitamin C, vitamin E, echinacea, or exercise reduces the chance of developing a cold. However, it is always recommended to get plenty of rest and practice good nutrition. TREATMENT  Treatment is directed at relieving symptoms. There is no cure. Antibiotics are not effective, because the infection is caused by a virus, not by bacteria. Treatment may include:  Increased fluid intake. Sports drinks offer valuable electrolytes, sugars, and fluids.  Breathing heated mist or steam (vaporizer or shower).  Eating chicken soup or other clear broths, and maintaining good nutrition.  Getting plenty of rest.  Using gargles or lozenges for comfort.  Controlling fevers with ibuprofen or acetaminophen as directed by your caregiver.  Increasing usage of your inhaler if you have asthma. Zinc gel and zinc lozenges, taken in the first 24 hours of the common cold, can shorten the duration and lessen the severity of symptoms. Pain medicines may help with fever, muscle aches, and throat pain. A variety of non-prescription medicines are available to treat congestion and runny nose. Your caregiver   can make recommendations and may suggest nasal or lung inhalers for other symptoms.  HOME CARE INSTRUCTIONS   Only take over-the-counter or prescription medicines for pain, discomfort, or fever as directed by your  caregiver.  Use a warm mist humidifier or inhale steam from a shower to increase air moisture. This may keep secretions moist and make it easier to breathe.  Drink enough water and fluids to keep your urine clear or pale yellow.  Rest as needed.  Return to work when your temperature has returned to normal or as your caregiver advises. You may need to stay home longer to avoid infecting others. You can also use a face mask and careful hand washing to prevent spread of the virus. SEEK MEDICAL CARE IF:   After the first few days, you feel you are getting worse rather than better.  You need your caregiver's advice about medicines to control symptoms.  You develop chills, worsening shortness of breath, or brown or red sputum. These may be signs of pneumonia.  You develop yellow or brown nasal discharge or pain in the face, especially when you bend forward. These may be signs of sinusitis.  You develop a fever, swollen neck glands, pain with swallowing, or Heeg areas in the back of your throat. These may be signs of strep throat. SEEK IMMEDIATE MEDICAL CARE IF:   You have a fever.  You develop severe or persistent headache, ear pain, sinus pain, or chest pain.  You develop wheezing, a prolonged cough, cough up blood, or have a change in your usual mucus (if you have chronic lung disease).  You develop sore muscles or a stiff neck. Document Released: 07/11/2000 Document Revised: 04/09/2011 Document Reviewed: 04/22/2013 [employer] Patient Information 2015 [employer]. This information is not intended to replace advice given to you by your health care provider. Make sure you discuss any questions you have with your health care provider.  

## 2013-10-11 NOTE — ED Notes (Signed)
Pt c/o cough that is productive with green sputum, nasal congestion for the past week, recent exposure to sick contacts with same symptoms,.

## 2013-10-11 NOTE — ED Notes (Signed)
Pt states congestion and cough and head is "stopped up" x 4 days

## 2013-10-14 NOTE — ED Provider Notes (Signed)
Medical screening examination/treatment/procedure(s) were performed by non-physician practitioner and as supervising physician I was immediately available for consultation/collaboration.   EKG Interpretation None       Gaven Eugene, MD 10/14/13 1235 

## 2013-12-03 ENCOUNTER — Encounter (HOSPITAL_COMMUNITY): Payer: Self-pay | Admitting: Emergency Medicine

## 2013-12-03 ENCOUNTER — Emergency Department (HOSPITAL_COMMUNITY)
Admission: EM | Admit: 2013-12-03 | Discharge: 2013-12-03 | Disposition: A | Discharge status: Home / Self Care | Payer: Medicaid Other | Attending: Emergency Medicine | Admitting: Emergency Medicine

## 2013-12-03 DIAGNOSIS — Z880 Allergy status to penicillin: Secondary | ICD-10-CM | POA: Insufficient documentation

## 2013-12-03 DIAGNOSIS — Z7952 Long term (current) use of systemic steroids: Secondary | ICD-10-CM | POA: Insufficient documentation

## 2013-12-03 DIAGNOSIS — Z79899 Other long term (current) drug therapy: Secondary | ICD-10-CM | POA: Diagnosis not present

## 2013-12-03 DIAGNOSIS — L509 Urticaria, unspecified: Secondary | ICD-10-CM | POA: Diagnosis not present

## 2013-12-03 DIAGNOSIS — R21 Rash and other nonspecific skin eruption: Secondary | ICD-10-CM | POA: Diagnosis present

## 2013-12-03 DIAGNOSIS — Z8659 Personal history of other mental and behavioral disorders: Secondary | ICD-10-CM | POA: Diagnosis not present

## 2013-12-03 DIAGNOSIS — J45909 Unspecified asthma, uncomplicated: Secondary | ICD-10-CM | POA: Insufficient documentation

## 2013-12-03 DIAGNOSIS — Z720 Tobacco use: Secondary | ICD-10-CM | POA: Diagnosis not present

## 2013-12-03 MED ORDER — HYDROXYZINE HCL 50 MG PO TABS: 50.0000 mg | ORAL_TABLET | Freq: Four times a day (QID) | ORAL | Status: DC | PRN

## 2013-12-03 MED ORDER — HYDROXYZINE HCL 25 MG PO TABS
50.0000 mg | ORAL_TABLET | Freq: Once | ORAL | Status: AC
  Administered 2013-12-03: 50 mg via ORAL
  Filled 2013-12-03: qty 2

## 2013-12-03 MED ORDER — PREDNISONE 50 MG PO TABS
60.0000 mg | ORAL_TABLET | Freq: Once | ORAL | Status: AC
  Administered 2013-12-03: 60 mg via ORAL
  Filled 2013-12-03 (×2): qty 1

## 2013-12-03 MED ORDER — HYDROXYZINE HCL 25 MG PO TABS: 25.0000 mg | ORAL_TABLET | Freq: Four times a day (QID) | ORAL | Status: DC | PRN

## 2013-12-03 MED ORDER — PREDNISONE 10 MG PO TABS: ORAL_TABLET | ORAL | Status: DC

## 2013-12-03 NOTE — ED Notes (Signed)
Pt has welts and rash on arms, legs, back and shoulders. Pt has tried cortisone and benadryl cream with no relief. Rash first appeared 1 week ago and is itchy.

## 2013-12-03 NOTE — Discharge Instructions (Signed)
Hives Hives are itchy, red, swollen areas of the skin. They can vary in size and location on your body. Hives can come and go for hours or several days (acute hives) or for several weeks (chronic hives). Hives do not spread from person to person (noncontagious). They may get worse with scratching, exercise, and emotional stress. CAUSES   Allergic reaction to food, additives, or drugs.  Infections, including the common cold.  Illness, such as vasculitis, lupus, or thyroid disease.  Exposure to sunlight, heat, or cold.  Exercise.  Stress.  Contact with chemicals. SYMPTOMS   Red or Zuccaro swollen patches on the skin. The patches may change size, shape, and location quickly and repeatedly.  Itching.  Swelling of the hands, feet, and face. This may occur if hives develop deeper in the skin. DIAGNOSIS  Your caregiver can usually tell what is wrong by performing a physical exam. Skin or blood tests may also be done to determine the cause of your hives. In some cases, the cause cannot be determined. TREATMENT  Mild cases usually get better with medicines such as antihistamines. Severe cases may require an emergency epinephrine injection. If the cause of your hives is known, treatment includes avoiding that trigger.  HOME CARE INSTRUCTIONS   Avoid causes that trigger your hives.  Take antihistamines as directed by your caregiver to reduce the severity of your hives. Non-sedating or low-sedating antihistamines are usually recommended. Do not drive while taking an antihistamine.  Take any other medicines prescribed for itching as directed by your caregiver.  Wear loose-fitting clothing.  Keep all follow-up appointments as directed by your caregiver. SEEK MEDICAL CARE IF:   You have persistent or severe itching that is not relieved with medicine.  You have painful or swollen joints. SEEK IMMEDIATE MEDICAL CARE IF:   You have a fever.  Your tongue or lips are swollen.  You have  trouble breathing or swallowing.  You feel tightness in the throat or chest.  You have abdominal pain. These problems may be the first sign of a life-threatening allergic reaction. Call your local emergency services (911 in [place] MAKE SURE YOU:   Understand these instructions.  Will watch your condition.  Will get help right away if you are not doing well or get worse. Document Released: 01/15/2005 Document Revised: 01/20/2013 Document Reviewed: 04/10/2011 [facility] [employer] Patient Information 2015 [employer] [place]. This information is not intended to replace advice given to you by your health care provider. Make sure you discuss any questions you have with your health care provider.  Take your next dose of prednisone tomorrow evening.  Try the Atarax given tonight which may help with itching better than the Benadryl you have tried.  If this does not help, try taking 2 Benadryl tablets in place of the Atarax.  Some people get better itch relief with one or the other of these medicines. Do not take both together as they are both medicines that can make you sleepy.

## 2013-12-04 NOTE — ED Provider Notes (Signed)
CSN: [id number]     Arrival date & time 12/03/13  2032 History   First MD Initiated Contact with Patient 12/03/13 2103     Chief Complaint  Patient presents with  . Rash     (Consider location/radiation/quality/duration/timing/severity/associated sxs/prior Treatment) Patient is a 28 y.o. female presenting with rash. The history is provided by the patient.  Rash Location:  Shoulder/arm Shoulder/arm rash location:  R shoulder, L shoulder, L upper arm and R upper arm Quality: itchiness   Quality: not blistering, not bruising, not burning, not draining, not painful and not weeping   Severity:  Moderate Duration:  1 week Progression:  Waxing and waning Chronicity:  New Context: not animal contact, not chemical exposure, not exposure to similar rash, not food, not insect bite/sting, not medications, not new detergent/soap, not plant contact and not sick contacts   Relieved by:  Nothing Worsened by:  Nothing tried Ineffective treatments:  Topical steroids and antihistamines Associated symptoms: no abdominal pain, no diarrhea, no fever, no hoarse voice, no myalgias, no nausea, no shortness of breath, no sore throat, no throat swelling, no tongue swelling and not wheezing     Past Medical History  Diagnosis Date  . Asthma     as a child  . PTSD (post-traumatic stress disorder)    Past Surgical History  Procedure Laterality Date  . Dilation and curettage of uterus    . Tubal ligation     No family history on file. History  Substance Use Topics  . Smoking status: Current Every Day Smoker -- 2.00 packs/day    Last Attempt to Quit: 02/10/2013  . Smokeless tobacco: Not on file  . Alcohol Use: No   OB History    No data available     Review of Systems  Constitutional: Negative for fever and chills.  HENT: Negative for hoarse voice and sore throat.   Respiratory: Negative for shortness of breath and wheezing.   Gastrointestinal: Negative for nausea, abdominal pain and diarrhea.   Musculoskeletal: Negative for myalgias.  Skin: Positive for rash.  Neurological: Negative for numbness.      Allergies  Bee venom; Mushroom extract complex; Shellfish allergy; Abilify; Ivp dye; Penicillins; and Seroquel  Home Medications   Prior to Admission medications   Medication Sig Start Date End Date Taking? Authorizing Provider  benzonatate (TESSALON) 200 MG capsule Take 1 capsule (200 mg total) by mouth every 8 (eight) hours. 10/11/13   Burgess AmorJulie Paxtyn Wisdom, PA-C  cetirizine-pseudoephedrine (ZYRTEC-D) 5-120 MG per tablet Take 1 tablet by mouth 2 (two) times daily. 10/11/13   Burgess AmorJulie Channon Ambrosini, PA-C  hydrOXYzine (ATARAX/VISTARIL) 25 MG tablet Take 1-2 tablets (25-50 mg total) by mouth every 6 (six) hours as needed for itching. 12/03/13   Burgess AmorJulie Svea Pusch, PA-C  predniSONE (DELTASONE) 10 MG tablet 6, 5, 4, 3, 2 then 1 tablet by mouth daily for 6 days total. 12/03/13   Burgess AmorJulie Treyshaun Keatts, PA-C   BP 116/66 mmHg  Pulse 87  Temp(Src) 99.2 F (37.3 C) (Oral)  Resp 18  Ht 5\' 3"  (1.6 m)  Wt 176 lb 6.4 oz (80.015 kg)  BMI 31.26 kg/m2  SpO2 100%  LMP 11/19/2013 Physical Exam  Constitutional: She appears well-developed and well-nourished. No distress.  HENT:  Head: Normocephalic.  Neck: Phonation normal. Neck supple.  Cardiovascular: Normal rate.   Pulmonary/Chest: Effort normal. No stridor. She has no wheezes.  Musculoskeletal: Normal range of motion. She exhibits no edema.  Skin: Rash noted. Rash is papular and urticarial.  Left upper  arm with several hive like lesions.  Right upper lateral arm with a more papular, insect bite quality rash.  Excoriations.  No surrounding erythema, no edema, no induration.    ED Course  Procedures (including critical care time) Labs Review Labs Reviewed - No data to display  Imaging Review No results found.   EKG Interpretation None      MDM   Final diagnoses:  Hives of unknown origin    Pt with h/o asthma, suspect atopic reaction to unknown source as pt  cannot identify any new exposures or environments.  She was placed on prednisone taper, advised trial of atarax in place of benadryl which may give better itch relief.  F/u with pcp or return here for any worsened or new sx.  The patient appears reasonably screened and/or stabilized for discharge and I doubt any other medical condition or other [facility] requiring further screening, evaluation, or treatment in the ED at this time prior to discharge.     Burgess AmorJulie Faustino Luecke, PA-C 12/04/13 1438  Benny LennertJoseph L Zammit, MD 12/07/13 [phone]

## 2014-05-23 ENCOUNTER — Emergency Department (HOSPITAL_COMMUNITY)
Admission: EM | Admit: 2014-05-23 | Discharge: 2014-05-23 | Disposition: A | Discharge status: Home / Self Care | Payer: Medicaid Other | Attending: Emergency Medicine | Admitting: Emergency Medicine

## 2014-05-23 ENCOUNTER — Encounter (HOSPITAL_COMMUNITY): Payer: Self-pay | Admitting: Emergency Medicine

## 2014-05-23 ENCOUNTER — Emergency Department (HOSPITAL_COMMUNITY): Payer: Medicaid Other

## 2014-05-23 DIAGNOSIS — Z880 Allergy status to penicillin: Secondary | ICD-10-CM | POA: Diagnosis not present

## 2014-05-23 DIAGNOSIS — Z720 Tobacco use: Secondary | ICD-10-CM | POA: Diagnosis not present

## 2014-05-23 DIAGNOSIS — Z9889 Other specified postprocedural states: Secondary | ICD-10-CM | POA: Insufficient documentation

## 2014-05-23 DIAGNOSIS — Z8659 Personal history of other mental and behavioral disorders: Secondary | ICD-10-CM | POA: Insufficient documentation

## 2014-05-23 DIAGNOSIS — J45909 Unspecified asthma, uncomplicated: Secondary | ICD-10-CM | POA: Diagnosis not present

## 2014-05-23 DIAGNOSIS — M25551 Pain in right hip: Secondary | ICD-10-CM | POA: Diagnosis present

## 2014-05-23 DIAGNOSIS — Z79899 Other long term (current) drug therapy: Secondary | ICD-10-CM | POA: Diagnosis not present

## 2014-05-23 DIAGNOSIS — R1031 Right lower quadrant pain: Secondary | ICD-10-CM | POA: Diagnosis not present

## 2014-05-23 DIAGNOSIS — Z9851 Tubal ligation status: Secondary | ICD-10-CM | POA: Diagnosis not present

## 2014-05-23 DIAGNOSIS — N72 Inflammatory disease of cervix uteri: Secondary | ICD-10-CM | POA: Diagnosis not present

## 2014-05-23 LAB — URINALYSIS, ROUTINE W REFLEX MICROSCOPIC
Bilirubin Urine: NEGATIVE
Glucose, UA: NEGATIVE mg/dL
Hgb urine dipstick: NEGATIVE
KETONES UR: NEGATIVE mg/dL
LEUKOCYTES UA: NEGATIVE
Nitrite: NEGATIVE
Protein, ur: NEGATIVE mg/dL
Specific Gravity, Urine: >1.03 — ABNORMAL HIGH (ref 1.005–1.030)
UROBILINOGEN UA: 1 mg/dL (ref 0.0–1.0)
pH: 6 (ref 5.0–8.0)

## 2014-05-23 LAB — CBC WITH DIFFERENTIAL/PLATELET
BASOS ABS: 0 10*3/uL (ref 0.0–0.1)
Basophils Relative: 0 % (ref 0–1)
EOS PCT: 3 % (ref 0–5)
Eosinophils Absolute: 0.2 10*3/uL (ref 0.0–0.7)
HCT: 37.2 % (ref 36.0–46.0)
HEMOGLOBIN: 12.8 g/dL (ref 12.0–15.0)
Lymphocytes Relative: 24 % (ref 12–46)
Lymphs Abs: 1.7 10*3/uL (ref 0.7–4.0)
MCH: 30 pg (ref 26.0–34.0)
MCHC: 34.4 g/dL (ref 30.0–36.0)
MCV: 87.3 fL (ref 78.0–100.0)
MONOS PCT: 10 % (ref 3–12)
Monocytes Absolute: 0.8 10*3/uL (ref 0.1–1.0)
Neutro Abs: 4.5 10*3/uL (ref 1.7–7.7)
Neutrophils Relative %: 63 % (ref 43–77)
Platelets: 160 10*3/uL (ref 150–400)
RBC: 4.26 MIL/uL (ref 3.87–5.11)
RDW: 12.5 % (ref 11.5–15.5)
WBC: 7.2 10*3/uL (ref 4.0–10.5)

## 2014-05-23 LAB — COMPREHENSIVE METABOLIC PANEL
ALK PHOS: 62 U/L (ref 39–117)
ALT: 11 U/L (ref 0–35)
ANION GAP: 6 (ref 5–15)
AST: 14 U/L (ref 0–37)
Albumin: 3.9 g/dL (ref 3.5–5.2)
BUN: 13 mg/dL (ref 6–23)
CALCIUM: 8.8 mg/dL (ref 8.4–10.5)
CO2: 25 mmol/L (ref 19–32)
Chloride: 107 mmol/L (ref 96–112)
Creatinine, Ser: 0.64 mg/dL (ref 0.50–1.10)
GFR calc Af Amer: >90 mL/min (ref >90)
GFR calc non Af Amer: >90 mL/min (ref >90)
Glucose, Bld: 107 mg/dL — ABNORMAL HIGH (ref 70–99)
Potassium: 3.8 mmol/L (ref 3.5–5.1)
Sodium: 138 mmol/L (ref 135–145)
Total Bilirubin: 0.7 mg/dL (ref 0.3–1.2)
Total Protein: 6.7 g/dL (ref 6.0–8.3)

## 2014-05-23 LAB — WET PREP, GENITAL
CLUE CELLS WET PREP: NONE SEEN
Trich, Wet Prep: NONE SEEN
YEAST WET PREP: NONE SEEN

## 2014-05-23 LAB — PREGNANCY, URINE: Preg Test, Ur: NEGATIVE

## 2014-05-23 MED ORDER — SODIUM CHLORIDE 0.9 % IV BOLUS (SEPSIS)
500.0000 mL | Freq: Once | INTRAVENOUS | Status: AC
  Administered 2014-05-23: 500 mL via INTRAVENOUS

## 2014-05-23 MED ORDER — KETOROLAC TROMETHAMINE 30 MG/ML IJ SOLN
30.0000 mg | Freq: Once | INTRAMUSCULAR | Status: AC
  Administered 2014-05-23: 30 mg via INTRAVENOUS
  Filled 2014-05-23: qty 1

## 2014-05-23 MED ORDER — MORPHINE SULFATE 4 MG/ML IJ SOLN
4.0000 mg | Freq: Once | INTRAMUSCULAR | Status: AC
  Administered 2014-05-23: 4 mg via INTRAVENOUS
  Filled 2014-05-23: qty 1

## 2014-05-23 MED ORDER — OXYCODONE-ACETAMINOPHEN 5-325 MG PO TABS: 1.0000 | ORAL_TABLET | ORAL | Status: DC | PRN

## 2014-05-23 MED ORDER — AZITHROMYCIN 1 G PO PACK
1.0000 g | PACK | Freq: Once | ORAL | Status: AC
  Administered 2014-05-23: 1 g via ORAL
  Filled 2014-05-23: qty 1

## 2014-05-23 MED ORDER — CEFTRIAXONE SODIUM 250 MG IJ SOLR
250.0000 mg | Freq: Once | INTRAMUSCULAR | Status: AC
  Administered 2014-05-23: 250 mg via INTRAMUSCULAR
  Filled 2014-05-23: qty 250

## 2014-05-23 MED ORDER — LIDOCAINE HCL (PF) 1 % IJ SOLN
INTRAMUSCULAR | Status: AC
  Filled 2014-05-23: qty 5

## 2014-05-23 MED ORDER — IOHEXOL 300 MG/ML  SOLN: 50.0000 mL | Freq: Once | INTRAMUSCULAR | Status: DC | PRN

## 2014-05-23 NOTE — ED Notes (Addendum)
Pt reports right hip pain radiating to right groin/leg for last several days. Pt denies any gu/gi symptoms,abdominal pain. Pt denies any known injury. Pt reports pain is worse with movement.

## 2014-05-23 NOTE — ED Notes (Signed)
CT gave contrast to patient.

## 2014-05-23 NOTE — ED Notes (Signed)
Iv d/c'd to right AC, catheter intact, site WNL, bandaid applied to site

## 2014-05-23 NOTE — Discharge Instructions (Signed)
We are treating you for a cervical infection. Prescription for pain medicine. CT scan showed no life-threatening condition. Return for fever, chills, worsening pain, dehydration, inability to keep food down.

## 2014-05-23 NOTE — ED Notes (Signed)
Pt verbalized understanding of no driving and to use caution within 4 hours of taking pain meds due to meds cause drowsiness 

## 2014-05-23 NOTE — ED Provider Notes (Signed)
CSN: [id number]     Arrival date & time 05/23/14  1159 History   First MD Initiated Contact with Patient 05/23/14 1309     Chief Complaint  Patient presents with  . Hip Pain     (Consider location/radiation/quality/duration/timing/severity/associated sxs/prior Treatment) HPI ... Pain in right suprapubic area with radiation to the right anterior superior iliac spine region and ultimately radiating to the right thigh for 3 days. She has tried ibuprofen and Tylenol without relief. Pain is worse with sexual intercourse. No vaginal bleeding or discharge. No fever, chills, dysuria, nausea, vomiting, diarrhea. Severity is moderate.  Past Medical History  Diagnosis Date  . Asthma     as a child  . PTSD (post-traumatic stress disorder)    Past Surgical History  Procedure Laterality Date  . Dilation and curettage of uterus    . Tubal ligation     History reviewed. No pertinent family history. History  Substance Use Topics  . Smoking status: Current Every Day Smoker -- 2.00 packs/day    Last Attempt to Quit: 02/10/2013  . Smokeless tobacco: Not on file  . Alcohol Use: No   OB History    No data available     Review of Systems  All other systems reviewed and are negative.     Allergies  Bee venom; Mushroom extract complex; Abilify; Penicillins; and Seroquel  Home Medications   Prior to Admission medications   Medication Sig Start Date End Date Taking? Authorizing Provider  diphenhydramine-acetaminophen (TYLENOL PM) 25-500 MG TABS Take 2 tablets by mouth at bedtime as needed.   Yes Historical Provider, MD  benzonatate (TESSALON) 200 MG capsule Take 1 capsule (200 mg total) by mouth every 8 (eight) hours. Patient not taking: Reported on 05/23/2014 10/11/13   Burgess AmorJulie Idol, PA-C  cetirizine-pseudoephedrine (ZYRTEC-D) 5-120 MG per tablet Take 1 tablet by mouth 2 (two) times daily. Patient not taking: Reported on 05/23/2014 10/11/13   Burgess AmorJulie Idol, PA-C  hydrOXYzine (ATARAX/VISTARIL) 25 MG  tablet Take 1-2 tablets (25-50 mg total) by mouth every 6 (six) hours as needed for itching. Patient not taking: Reported on 05/23/2014 12/03/13   Burgess AmorJulie Idol, PA-C  oxyCODONE-acetaminophen (PERCOCET) 5-325 MG per tablet Take 1-2 tablets by mouth every 4 (four) hours as needed. 05/23/14   Donnetta HutchingBrian Mizani Dilday, MD  predniSONE (DELTASONE) 10 MG tablet 6, 5, 4, 3, 2 then 1 tablet by mouth daily for 6 days total. Patient not taking: Reported on 05/23/2014 12/03/13   Burgess AmorJulie Idol, PA-C   BP 129/70 mmHg  Pulse 72  Temp(Src) 98.8 F (37.1 C) (Oral)  Resp 17  Ht 5\' 3"  (1.6 m)  Wt 180 lb (81.647 kg)  BMI 31.89 kg/m2  SpO2 100%  LMP 05/09/2014 Physical Exam  Constitutional: She is oriented to person, place, and time. She appears well-developed and well-nourished.  HENT:  Head: Normocephalic and atraumatic.  Eyes: Conjunctivae and EOM are normal. Pupils are equal, round, and reactive to light.  Neck: Normal range of motion. Neck supple.  Cardiovascular: Normal rate and regular rhythm.   Pulmonary/Chest: Effort normal and breath sounds normal.  Abdominal: Soft. Bowel sounds are normal.  Minimal right suprapubic tenderness  Genitourinary:  External genitalia normal. No cervical discharge. Positive cervical motion tenderness. Questionable right adnexal tenderness  Musculoskeletal: Normal range of motion.  Neurological: She is alert and oriented to person, place, and time.  Skin: Skin is warm and dry.  Psychiatric: She has a normal mood and affect. Her behavior is normal.  Nursing note and  vitals reviewed.   ED Course  Procedures (including critical care time) Labs Review Labs Reviewed  WET PREP, GENITAL - Abnormal; Notable for the following:    WBC, Wet Prep HPF POC FEW (*)    All other components within normal limits  URINALYSIS, ROUTINE W REFLEX MICROSCOPIC - Abnormal; Notable for the following:    Specific Gravity, Urine >1.030 (*)    All other components within normal limits  COMPREHENSIVE METABOLIC  PANEL - Abnormal; Notable for the following:    Glucose, Bld 107 (*)    All other components within normal limits  PREGNANCY, URINE  CBC WITH DIFFERENTIAL/PLATELET  GC/CHLAMYDIA PROBE AMP ([place])    Imaging Review Ct Abdomen Pelvis Wo Contrast  05/23/2014   CLINICAL DATA:  29 year old female with a history of right hip pain.  EXAM: CT ABDOMEN AND PELVIS WITHOUT CONTRAST  TECHNIQUE: Multidetector CT imaging of the abdomen and pelvis was performed following the standard protocol without IV contrast.  COMPARISON:  02/25/2013  FINDINGS: Lower chest:  Unremarkable appearance of the soft tissues of the chest wall.  Heart size within normal limits.  No pericardial fluid/thickening.  No lower mediastinal adenopathy.  Unremarkable appearance of the distal esophagus.  No hiatal hernia.  No confluent airspace disease, pleural fluid, or pneumothorax within visualized lung.  Abdomen/pelvis:  Unremarkable appearance of liver and spleen.  Unremarkable appearance of bilateral adrenal glands.  No peripancreatic or pericholecystic fluid or inflammatory changes.  No radio-opaque gallstones.  No intrahepatic or extrahepatic biliary ductal dilatation.  No intra-peritoneal free air or significant free-fluid.  No abnormally dilated small bowel or colon. No transition point. No inflammatory changes of the mesenteries. Oral contrast extends to the distal small bowel.  Normal appendix identified.  No diverticular disease.  Right Kidney/Ureter:  No hydronephrosis. No nephrolithiasis. No perinephric stranding. Unremarkable course of the right ureter.  Left Kidney/Ureter:  No hydronephrosis. No nephrolithiasis. No perinephric stranding.  Unremarkable course of the left ureter.  There is a hyperdense appearance of the papillary tips of the bilateral kidneys without nephrolithiasis. No inflammatory changes.  Unremarkable appearance of the urinary bladder.  Unremarkable appearance of the uterus with small amount of fluid within the  endometrial canal. Likely follicular changes of the bilateral ovaries. Small amount of trace fluid adjacent to the right adnexa.  No significant vascular calcification. No aneurysm or periaortic fluid identified.  Musculoskeletal:  No displaced fracture identified.  No significant degenerative changes of the spine.  IMPRESSION: No acute finding to account for the patient's symptoms.  No nephrolithiasis or hydronephrosis. There is a hyperdense appearance of the papillary tips of the bilateral kidneys. This is nonspecific and can be seen with dehydration, though could alternatively be the form fruste of medullary sponge kidney. Consideration of referral for nephrology evaluation may be useful.  Physiologic changes of the bilateral at adnexa.  Signed,  Yvone Neu. Loreta Ave, DO  [facility]  [facility]   Electronically Signed   By: Gilmer Mor D.O.   On: 05/23/2014 17:31     EKG Interpretation None      MDM   Final diagnoses:  Suprapubic pain, right  Cervicitis    No acute abdomen. CT scan shows no acute findings. Cervix is tender to palpation, although there is no discharge. Will treat for cervicitis with Rocephin 250 mg IM and Zithromax 1 g orally. Discharge medications Percocet. Patient given strict discharge instructions.    Donnetta Hutching, MD 05/23/14 [phone]

## 2014-05-24 LAB — GC/CHLAMYDIA PROBE AMP (~~LOC~~) NOT AT ARMC
Chlamydia: NEGATIVE
Neisseria Gonorrhea: NEGATIVE

## 2014-05-25 ENCOUNTER — Encounter (HOSPITAL_COMMUNITY): Payer: Self-pay | Admitting: Cardiology

## 2014-05-25 ENCOUNTER — Emergency Department (HOSPITAL_COMMUNITY): Payer: Medicaid Other

## 2014-05-25 ENCOUNTER — Emergency Department (HOSPITAL_COMMUNITY)
Admission: EM | Admit: 2014-05-25 | Discharge: 2014-05-25 | Disposition: A | Discharge status: Home / Self Care | Payer: Medicaid Other | Attending: Emergency Medicine | Admitting: Emergency Medicine

## 2014-05-25 DIAGNOSIS — Z3202 Encounter for pregnancy test, result negative: Secondary | ICD-10-CM | POA: Diagnosis not present

## 2014-05-25 DIAGNOSIS — Z9851 Tubal ligation status: Secondary | ICD-10-CM | POA: Diagnosis not present

## 2014-05-25 DIAGNOSIS — R102 Pelvic and perineal pain: Secondary | ICD-10-CM | POA: Diagnosis present

## 2014-05-25 DIAGNOSIS — Z90710 Acquired absence of both cervix and uterus: Secondary | ICD-10-CM | POA: Insufficient documentation

## 2014-05-25 DIAGNOSIS — Z720 Tobacco use: Secondary | ICD-10-CM | POA: Diagnosis not present

## 2014-05-25 DIAGNOSIS — Z79899 Other long term (current) drug therapy: Secondary | ICD-10-CM | POA: Diagnosis not present

## 2014-05-25 DIAGNOSIS — J45909 Unspecified asthma, uncomplicated: Secondary | ICD-10-CM | POA: Insufficient documentation

## 2014-05-25 DIAGNOSIS — Z8659 Personal history of other mental and behavioral disorders: Secondary | ICD-10-CM | POA: Diagnosis not present

## 2014-05-25 DIAGNOSIS — Z880 Allergy status to penicillin: Secondary | ICD-10-CM | POA: Diagnosis not present

## 2014-05-25 LAB — CBC WITH DIFFERENTIAL/PLATELET
Basophils Absolute: 0 10*3/uL (ref 0.0–0.1)
Basophils Relative: 0 % (ref 0–1)
EOS ABS: 0 10*3/uL (ref 0.0–0.7)
Eosinophils Relative: 0 % (ref 0–5)
HEMATOCRIT: 39 % (ref 36.0–46.0)
HEMOGLOBIN: 13.3 g/dL (ref 12.0–15.0)
Lymphocytes Relative: 12 % (ref 12–46)
Lymphs Abs: 1.2 10*3/uL (ref 0.7–4.0)
MCH: 29.9 pg (ref 26.0–34.0)
MCHC: 34.1 g/dL (ref 30.0–36.0)
MCV: 87.6 fL (ref 78.0–100.0)
MONO ABS: 0.7 10*3/uL (ref 0.1–1.0)
Monocytes Relative: 7 % (ref 3–12)
Neutro Abs: 8.1 10*3/uL — ABNORMAL HIGH (ref 1.7–7.7)
Neutrophils Relative %: 81 % — ABNORMAL HIGH (ref 43–77)
Platelets: 185 10*3/uL (ref 150–400)
RBC: 4.45 MIL/uL (ref 3.87–5.11)
RDW: 12.1 % (ref 11.5–15.5)
WBC: 10 10*3/uL (ref 4.0–10.5)

## 2014-05-25 LAB — URINALYSIS, ROUTINE W REFLEX MICROSCOPIC
Bilirubin Urine: NEGATIVE
GLUCOSE, UA: NEGATIVE mg/dL
Hgb urine dipstick: NEGATIVE
Ketones, ur: 15 mg/dL — AB
Leukocytes, UA: NEGATIVE
Nitrite: NEGATIVE
Protein, ur: NEGATIVE mg/dL
Specific Gravity, Urine: >1.03 — ABNORMAL HIGH (ref 1.005–1.030)
Urobilinogen, UA: 0.2 mg/dL (ref 0.0–1.0)
pH: 6 (ref 5.0–8.0)

## 2014-05-25 LAB — COMPREHENSIVE METABOLIC PANEL
ALT: 12 U/L (ref 0–35)
AST: 13 U/L (ref 0–37)
Albumin: 4 g/dL (ref 3.5–5.2)
Alkaline Phosphatase: 58 U/L (ref 39–117)
Anion gap: 9 (ref 5–15)
BUN: 12 mg/dL (ref 6–23)
CHLORIDE: 104 mmol/L (ref 96–112)
CO2: 24 mmol/L (ref 19–32)
Calcium: 8.9 mg/dL (ref 8.4–10.5)
Creatinine, Ser: 0.66 mg/dL (ref 0.50–1.10)
GFR calc Af Amer: >90 mL/min (ref >90)
GFR calc non Af Amer: >90 mL/min (ref >90)
Glucose, Bld: 104 mg/dL — ABNORMAL HIGH (ref 70–99)
POTASSIUM: 3.6 mmol/L (ref 3.5–5.1)
Sodium: 137 mmol/L (ref 135–145)
Total Bilirubin: 0.5 mg/dL (ref 0.3–1.2)
Total Protein: 7.3 g/dL (ref 6.0–8.3)

## 2014-05-25 LAB — PREGNANCY, URINE: PREG TEST UR: NEGATIVE

## 2014-05-25 LAB — LIPASE, BLOOD: LIPASE: 21 U/L (ref 11–59)

## 2014-05-25 MED ORDER — MORPHINE SULFATE 4 MG/ML IJ SOLN
6.0000 mg | Freq: Once | INTRAMUSCULAR | Status: AC
  Administered 2014-05-25: 6 mg via INTRAVENOUS
  Filled 2014-05-25: qty 2

## 2014-05-25 MED ORDER — NAPROXEN 500 MG PO TABS: 500.0000 mg | ORAL_TABLET | Freq: Two times a day (BID) | ORAL | Status: DC | PRN

## 2014-05-25 MED ORDER — IBUPROFEN 400 MG PO TABS
600.0000 mg | ORAL_TABLET | Freq: Once | ORAL | Status: AC
  Administered 2014-05-25: 600 mg via ORAL
  Filled 2014-05-25: qty 2

## 2014-05-25 NOTE — ED Notes (Signed)
Lower abdominal pain that started Friday.  Seen here Sunday with same.

## 2014-05-25 NOTE — ED Provider Notes (Signed)
CSN: [id number]     Arrival date & time 05/25/14  1032 History   First MD Initiated Contact with Patient 05/25/14 1151     Chief Complaint  Patient presents with  . Abdominal Pain     (Consider location/radiation/quality/duration/timing/severity/associated sxs/prior Treatment) HPI Comments: 29 year old female with bipolar, schizophrenia presents with worsening right lower pelvic pain since Thursday. Patient had a CAT scan with no acute findings were seen in the ER. Pain persistent. No vaginal symptoms no urinary symptoms. No fevers or chills. Pain constant no history of kidney stone or ovarian abscess/cyst. No new sexual partners.  Patient is a 29 y.o. female presenting with abdominal pain. The history is provided by the patient.  Abdominal Pain Associated symptoms: no chest pain, no chills, no dysuria, no fever, no shortness of breath, no vaginal bleeding, no vaginal discharge and no vomiting     Past Medical History  Diagnosis Date  . Asthma     as a child  . PTSD (post-traumatic stress disorder)    Past Surgical History  Procedure Laterality Date  . Dilation and curettage of uterus    . Tubal ligation     History reviewed. No pertinent family history. History  Substance Use Topics  . Smoking status: Current Every Day Smoker -- 2.00 packs/day    Last Attempt to Quit: 02/10/2013  . Smokeless tobacco: Not on file  . Alcohol Use: No   OB History    No data available     Review of Systems  Constitutional: Negative for fever and chills.  HENT: Negative for congestion.   Eyes: Negative for visual disturbance.  Respiratory: Negative for shortness of breath.   Cardiovascular: Negative for chest pain.  Gastrointestinal: Positive for abdominal pain. Negative for vomiting.  Genitourinary: Negative for dysuria, flank pain, vaginal bleeding and vaginal discharge.  Musculoskeletal: Negative for back pain, neck pain and neck stiffness.  Skin: Negative for rash.  Neurological:  Negative for light-headedness and headaches.      Allergies  Bee venom; Mushroom extract complex; Abilify; Penicillins; and Seroquel  Home Medications   Prior to Admission medications   Medication Sig Start Date End Date Taking? Authorizing Provider  diphenhydramine-acetaminophen (TYLENOL PM) 25-500 MG TABS Take 1 tablet by mouth at bedtime as needed (sleep).   Yes Historical Provider, MD  oxyCODONE-acetaminophen (PERCOCET) 5-325 MG per tablet Take 1-2 tablets by mouth every 4 (four) hours as needed. 05/23/14  Yes Donnetta Hutching, MD  benzonatate (TESSALON) 200 MG capsule Take 1 capsule (200 mg total) by mouth every 8 (eight) hours. Patient not taking: Reported on 05/23/2014 10/11/13   Burgess Amor, PA-C  cetirizine-pseudoephedrine (ZYRTEC-D) 5-120 MG per tablet Take 1 tablet by mouth 2 (two) times daily. Patient not taking: Reported on 05/23/2014 10/11/13   Burgess Amor, PA-C  hydrOXYzine (ATARAX/VISTARIL) 25 MG tablet Take 1-2 tablets (25-50 mg total) by mouth every 6 (six) hours as needed for itching. Patient not taking: Reported on 05/23/2014 12/03/13   Burgess Amor, PA-C  predniSONE (DELTASONE) 10 MG tablet 6, 5, 4, 3, 2 then 1 tablet by mouth daily for 6 days total. Patient not taking: Reported on 05/23/2014 12/03/13   Burgess Amor, PA-C   BP 124/67 mmHg  Pulse 89  Temp(Src) 98.1 F (36.7 C) (Oral)  Resp 18  Ht  (1.6 m)  Wt 180 lb (81.647 kg)  BMI 31.89 kg/m2  SpO2 100%  LMP 05/09/2014 Physical Exam  Constitutional: She is oriented to person, place, and time. She appears well-developed  and well-nourished.  HENT:  Head: Normocephalic and atraumatic.  Eyes: Conjunctivae are normal. Right eye exhibits no discharge. Left eye exhibits no discharge.  Neck: Normal range of motion. Neck supple. No tracheal deviation present.  Cardiovascular: Normal rate and regular rhythm.   Pulmonary/Chest: Effort normal and breath sounds normal.  Abdominal: Soft. She exhibits no distension. There is  tenderness (right pelvic). There is no guarding.  Musculoskeletal: She exhibits no edema.  Neurological: She is alert and oriented to person, place, and time.  Skin: Skin is warm. No rash noted.  Psychiatric: She has a normal mood and affect.  Nursing note and vitals reviewed.   ED Course  Procedures (including critical care time) Labs Review Labs Reviewed  CBC WITH DIFFERENTIAL/PLATELET - Abnormal; Notable for the following:    Neutrophils Relative % 81 (*)    Neutro Abs 8.1 (*)    All other components within normal limits  COMPREHENSIVE METABOLIC PANEL - Abnormal; Notable for the following:    Glucose, Bld 104 (*)    All other components within normal limits  URINALYSIS, ROUTINE W REFLEX MICROSCOPIC - Abnormal; Notable for the following:    Specific Gravity, Urine >1.030 (*)    Ketones, ur 15 (*)    All other components within normal limits  LIPASE, BLOOD  PREGNANCY, URINE    Imaging Review Ct Abdomen Pelvis Wo Contrast  05/23/2014   CLINICAL DATA:  [age] year old female with a history of right hip pain.  EXAM: CT ABDOMEN AND PELVIS WITHOUT CONTRAST  TECHNIQUE: Multidetector CT imaging of the abdomen and pelvis was performed following the standard protocol without IV contrast.  COMPARISON:  02/25/2013  FINDINGS: Lower chest:  Unremarkable appearance of the soft tissues of the chest wall.  Heart size within normal limits.  No pericardial fluid/thickening.  No lower mediastinal adenopathy.  Unremarkable appearance of the distal esophagus.  No hiatal hernia.  No confluent airspace disease, pleural fluid, or pneumothorax within visualized lung.  Abdomen/pelvis:  Unremarkable appearance of liver and spleen.  Unremarkable appearance of bilateral adrenal glands.  No peripancreatic or pericholecystic fluid or inflammatory changes.  No radio-opaque gallstones.  No intrahepatic or extrahepatic biliary ductal dilatation.  No intra-peritoneal free air or significant free-fluid.  No abnormally dilated  small bowel or colon. No transition point. No inflammatory changes of the mesenteries. Oral contrast extends to the distal small bowel.  Normal appendix identified.  No diverticular disease.  Right Kidney/Ureter:  No hydronephrosis. No nephrolithiasis. No perinephric stranding. Unremarkable course of the right ureter.  Left Kidney/Ureter:  No hydronephrosis. No nephrolithiasis. No perinephric stranding.  Unremarkable course of the left ureter.  There is a hyperdense appearance of the papillary tips of the bilateral kidneys without nephrolithiasis. No inflammatory changes.  Unremarkable appearance of the urinary bladder.  Unremarkable appearance of the uterus with small amount of fluid within the endometrial canal. Likely follicular changes of the bilateral ovaries. Small amount of trace fluid adjacent to the right adnexa.  No significant vascular calcification. No aneurysm or periaortic fluid identified.  Musculoskeletal:  No displaced fracture identified.  No significant degenerative changes of the spine.  IMPRESSION: No acute finding to account for the patient's symptoms.  No nephrolithiasis or hydronephrosis. There is a hyperdense appearance of the papillary tips of the bilateral kidneys. This is nonspecific and can be seen with dehydration, though could alternatively be the form fruste of medullary sponge kidney. Consideration of referral for nephrology evaluation may be useful.  Physiologic changes of the bilateral at adnexa.  Signed,  Yvone Neu. Loreta Ave, DO  [facility]  [facility]   Electronically Signed   By: Gilmer Mor D.O.   On: 05/23/2014 17:31     EKG Interpretation None      MDM   Final diagnoses:  Pelvic pain in female    Patient has focal right pelvic pain, patient was treated for STDs, denies new sexual partners, vaginal symptoms. CT scan results reviewed normal appendix. Ultrasound ordered for concern for cyst or ruptured cyst. Discuss  with radiology possibly recent ruptured cyst small amount of fluid, no large cysts seen, color-flow visualized in the ovary. Recommended close outpatient follow-up with gynecology.  Results and differential diagnosis were discussed with the patient/parent/guardian. Close follow up outpatient was discussed, comfortable with the plan.   Medications  morphine 4 MG/ML injection 6 mg (6 mg Intravenous Given 05/25/14 1341)    Filed Vitals:   05/25/14 1040  BP: 124/67  Pulse: 89  Temp: 98.1 F (36.7 C)  TempSrc: Oral  Resp: 18  Height:  (1.6 m)  Weight: 180 lb (81.647 kg)  SpO2: 100%    Final diagnoses:  Pelvic pain in female      Blane Ohara, MD 05/25/14 1520

## 2014-05-25 NOTE — Discharge Instructions (Signed)
If you were given medicines take as directed.  If you are on coumadin or contraceptives realize their levels and effectiveness is altered by many different medicines.  If you have any reaction (rash, tongues swelling, other) to the medicines stop taking and see a physician.   Please follow up as directed and return to the ER or see a physician for new or worsening symptoms.  Thank you. Filed Vitals:   05/25/14 1040  BP: 124/67  Pulse: 89  Temp: 98.1 F (36.7 C)  TempSrc: Oral  Resp: 18  Height: 5\' 3"  (1.6 m)  Weight: 180 lb (81.647 kg)  SpO2: 100%

## 2015-04-15 ENCOUNTER — Other Ambulatory Visit (HOSPITAL_COMMUNITY): Payer: Self-pay | Admitting: Family

## 2015-04-15 DIAGNOSIS — N63 Unspecified lump in unspecified breast: Secondary | ICD-10-CM

## 2015-04-21 ENCOUNTER — Encounter (HOSPITAL_COMMUNITY): Payer: Self-pay | Admitting: Emergency Medicine

## 2015-04-21 ENCOUNTER — Emergency Department (HOSPITAL_COMMUNITY)
Admission: EM | Admit: 2015-04-21 | Discharge: 2015-04-22 | Disposition: A | Discharge status: Home / Self Care | Payer: Medicaid Other | Attending: Emergency Medicine | Admitting: Emergency Medicine

## 2015-04-21 DIAGNOSIS — Z9851 Tubal ligation status: Secondary | ICD-10-CM | POA: Insufficient documentation

## 2015-04-21 DIAGNOSIS — D72829 Elevated white blood cell count, unspecified: Secondary | ICD-10-CM | POA: Diagnosis not present

## 2015-04-21 DIAGNOSIS — T7849XA Other allergy, initial encounter: Secondary | ICD-10-CM | POA: Diagnosis not present

## 2015-04-21 DIAGNOSIS — R1084 Generalized abdominal pain: Secondary | ICD-10-CM | POA: Diagnosis present

## 2015-04-21 DIAGNOSIS — J45909 Unspecified asthma, uncomplicated: Secondary | ICD-10-CM | POA: Insufficient documentation

## 2015-04-21 DIAGNOSIS — F17200 Nicotine dependence, unspecified, uncomplicated: Secondary | ICD-10-CM | POA: Insufficient documentation

## 2015-04-21 DIAGNOSIS — R112 Nausea with vomiting, unspecified: Secondary | ICD-10-CM

## 2015-04-21 DIAGNOSIS — R197 Diarrhea, unspecified: Secondary | ICD-10-CM | POA: Insufficient documentation

## 2015-04-21 LAB — COMPREHENSIVE METABOLIC PANEL
ALK PHOS: 63 U/L (ref 38–126)
ALT: 15 U/L (ref 14–54)
AST: 20 U/L (ref 15–41)
Albumin: 4.7 g/dL (ref 3.5–5.0)
Anion gap: 12 (ref 5–15)
BILIRUBIN TOTAL: 0.9 mg/dL (ref 0.3–1.2)
BUN: 16 mg/dL (ref 6–20)
CALCIUM: 8.7 mg/dL — AB (ref 8.9–10.3)
CHLORIDE: 102 mmol/L (ref 101–111)
CO2: 24 mmol/L (ref 22–32)
CREATININE: 0.9 mg/dL (ref 0.44–1.00)
GFR calc Af Amer: >60 mL/min (ref >60)
Glucose, Bld: 129 mg/dL — ABNORMAL HIGH (ref 65–99)
Potassium: 4.2 mmol/L (ref 3.5–5.1)
Sodium: 138 mmol/L (ref 135–145)
Total Protein: 7.6 g/dL (ref 6.5–8.1)

## 2015-04-21 LAB — CBC
HCT: 45.9 % (ref 36.0–46.0)
Hemoglobin: 16.1 g/dL — ABNORMAL HIGH (ref 12.0–15.0)
MCH: 30.8 pg (ref 26.0–34.0)
MCHC: 35.1 g/dL (ref 30.0–36.0)
MCV: 87.8 fL (ref 78.0–100.0)
PLATELETS: 150 10*3/uL (ref 150–400)
RBC: 5.23 MIL/uL — ABNORMAL HIGH (ref 3.87–5.11)
RDW: 12.4 % (ref 11.5–15.5)
WBC: 11.4 10*3/uL — AB (ref 4.0–10.5)

## 2015-04-21 LAB — LIPASE, BLOOD: LIPASE: 25 U/L (ref 11–51)

## 2015-04-21 LAB — CBG MONITORING, ED: GLUCOSE-CAPILLARY: 101 mg/dL — AB (ref 65–99)

## 2015-04-21 MED ORDER — LOPERAMIDE HCL 2 MG PO CAPS
4.0000 mg | ORAL_CAPSULE | Freq: Once | ORAL | Status: AC
  Administered 2015-04-21: 4 mg via ORAL
  Filled 2015-04-21: qty 2

## 2015-04-21 MED ORDER — KETOROLAC TROMETHAMINE 30 MG/ML IJ SOLN
30.0000 mg | Freq: Once | INTRAMUSCULAR | Status: AC
  Administered 2015-04-21: 30 mg via INTRAVENOUS
  Filled 2015-04-21: qty 1

## 2015-04-21 MED ORDER — ONDANSETRON HCL 4 MG/2ML IJ SOLN
4.0000 mg | Freq: Once | INTRAMUSCULAR | Status: AC
  Administered 2015-04-21: 4 mg via INTRAVENOUS
  Filled 2015-04-21: qty 2

## 2015-04-21 MED ORDER — DICYCLOMINE HCL 10 MG/ML IM SOLN
20.0000 mg | Freq: Once | INTRAMUSCULAR | Status: AC
  Administered 2015-04-21: 20 mg via INTRAMUSCULAR
  Filled 2015-04-21: qty 2

## 2015-04-21 NOTE — ED Provider Notes (Signed)
TIME SEEN: 11:27 PM  CHIEF COMPLAINT: Abdominal pain  HPI:  HPI Comments: Paula Colon is a 30 y.o. female, with no pertinent medical history, who presents to the Emergency Department complaining of constant, severe generalized abdominal pain, worse in periumbilical region, with associated nausea, vomiting and diarrhea onset today. Pt states she is unable to keep fluids down and immediately vomits after consumption. She is positive for sick contacts who are ill with the flu. She denies recent abroad travel, dysuria, hematuria, abnormal vaginal bleeding or discharge, recent antibiotic course, or recent hospitalization. LNMP 04/13/15. She notes a PShx of tubal ligation. Allergy to penicillin.   ROS: See HPI Constitutional: no fever  Eyes: no drainage  ENT: no runny nose   Cardiovascular:  no chest pain  Resp: no SOB  GI: vomiting, diarrhea GU: no dysuria Integumentary: no rash  Allergy: no hives  Musculoskeletal: no leg swelling  Neurological: no slurred speech ROS otherwise negative  PAST MEDICAL HISTORY/PAST SURGICAL HISTORY:  Past Medical History  Diagnosis Date  . Asthma     as a child  . PTSD (post-traumatic stress disorder)     MEDICATIONS:  Prior to Admission medications   Medication Sig Start Date End Date Taking? Authorizing Provider  benzonatate (TESSALON) 200 MG capsule Take 1 capsule (200 mg total) by mouth every 8 (eight) hours. Patient not taking: Reported on 05/23/2014 10/11/13   Burgess AmorJulie Idol, PA-C  cetirizine-pseudoephedrine (ZYRTEC-D) 5-120 MG per tablet Take 1 tablet by mouth 2 (two) times daily. Patient not taking: Reported on 05/23/2014 10/11/13   Burgess AmorJulie Idol, PA-C  diphenhydramine-acetaminophen (TYLENOL PM) 25-500 MG TABS Take 1 tablet by mouth at bedtime as needed (sleep).    Historical Provider, MD  hydrOXYzine (ATARAX/VISTARIL) 25 MG tablet Take 1-2 tablets (25-50 mg total) by mouth every 6 (six) hours as needed for itching. Patient not taking: Reported on  05/23/2014 12/03/13   Burgess AmorJulie Idol, PA-C  naproxen (NAPROSYN) 500 MG tablet Take 1 tablet (500 mg total) by mouth 2 (two) times daily as needed. 05/25/14   Blane OharaJoshua Zavitz, MD  oxyCODONE-acetaminophen (PERCOCET) 5-325 MG per tablet Take 1-2 tablets by mouth every 4 (four) hours as needed. 05/23/14   Donnetta HutchingBrian Cook, MD  predniSONE (DELTASONE) 10 MG tablet 6, 5, 4, 3, 2 then 1 tablet by mouth daily for 6 days total. Patient not taking: Reported on 05/23/2014 12/03/13   Burgess AmorJulie Idol, PA-C    ALLERGIES:  Allergies  Allergen Reactions  . Bee Venom Anaphylaxis  . Mushroom Extract Complex Anaphylaxis  . Abilify [Aripiprazole]     Joints lock up, fixated eyes  . Penicillins Rash    Has patient had a PCN reaction causing immediate rash, facial/tongue/throat swelling, SOB or lightheadedness with hypotension: Yes Has patient had a PCN reaction causing severe rash involving mucus membranes or skin necrosis: No Has patient had a PCN reaction that required hospitalization No Has patient had a PCN reaction occurring within the last 10 years: No If all of the above answers are "NO", then may proceed with Cephalosporin use.   Marland Kitchen. Seroquel [Quetiapine Fumarate] Rash    SOCIAL HISTORY:  Social History  Substance Use Topics  . Smoking status: Current Every Day Smoker -- 2.00 packs/day    Last Attempt to Quit: 02/10/2013  . Smokeless tobacco: Not on file  . Alcohol Use: No    FAMILY HISTORY: History reviewed. No pertinent family history.  EXAM: BP 121/79 mmHg  Pulse 104  Temp(Src) 100.2 F (37.9 C) (Oral)  Resp  18  Ht  (1.6 m)  Wt 178 lb (80.74 kg)  BMI 31.54 kg/m2  SpO2 99%  LMP 04/13/2015 CONSTITUTIONAL: Alert and oriented and responds appropriately to questions. Well-appearing; well-nourished HEAD: Normocephalic EYES: Conjunctivae clear, PERRL ENT: normal nose; no rhinorrhea; moist mucous membranes NECK: Supple, no meningismus, no LAD  CARD: Regular rhythm, tachycardic; S1 and S2 appreciated; no  murmurs, no clicks, no rubs, no gallops RESP: Normal chest excursion without splinting or tachypnea; breath sounds clear and equal bilaterally; no wheezes, no rhonchi, no rales, no hypoxia or respiratory distress, speaking full sentences ABD/GI: Normal bowel sounds; non-distended; soft, diffuse mild abdominal tenderness, worse around the umbilicus, no rebound, no guarding, no peritoneal signs BACK:  The back appears normal and is non-tender to palpation, there is no CVA tenderness EXT: Normal ROM in all joints; non-tender to palpation; no edema; normal capillary refill; no cyanosis, no calf tenderness or swelling    SKIN: Normal color for age and race; warm; no rash NEURO: Moves all extremities equally, sensation to light touch intact diffusely, cranial nerves II through XII intact PSYCH: The patient's mood and manner are appropriate. Grooming and personal hygiene are appropriate.  MEDICAL DECISION MAKING: Patient here with fever, vomiting and diarrhea. Suspect viral gastroenteritis. Abdominal exam is benign. Reports most of her pain is around her umbilicus. She is nontender in the right lower quadrant and has negative Murphy sign. Doubt colitis, diverticulitis, appendicitis, cholecystitis. We'll obtain labs, urine. Will give IV fluids, Zofran, Bentyl, Toradol and reassess. Also we'll give Imodium for diarrhea.  ED PROGRESS: Patient's labs are unremarkable other than mild leukocytosis of 11.4. She is not pregnant. Repeat abdominal exam is still benign. She reports feeling better and is drinking in the room. Discussed with her that I think this is a viral illness but have discussed with her that appendicitis can progress and that if she develops right lower quadrant pain, vomiting that does not stop, blood in her stool or any other symptoms concerning her I want her to return to the hospital. She verbalizes understanding and is comfortable with this plan. We'll discharge with Bentyl, Zofran and Imodium  prescriptions.   EKG Interpretation  Date/Time:  Thursday April 21 2015 18:34:20 EDT Ventricular Rate:  128 PR Interval:  122 QRS Duration: 76 QT Interval:  302 QTC Calculation: 440 R Axis:   67 Text Interpretation:  Sinus tachycardia Possible Left atrial enlargement Cannot rule out Anterior infarct , age undetermined Abnormal ECG No significant change since last tracing other than rate is faster Confirmed by Indiana Pechacek,  DO, Jillene Wehrenberg [phone]) on 04/21/2015 11:32:23 PM       I personally performed the services described in this documentation, which was scribed in my presence. The recorded information has been reviewed and is accurate.    Layla Maw Dsean Vantol, DO 04/22/15 [phone]

## 2015-04-21 NOTE — ED Notes (Signed)
Pt c/o n/v, incontinence x2, abdominal pain (generalized).  Pt also reports dizziness starting four hours ago.  Pt denies unilateral weakness.  Alert and oriented.  130/68,

## 2015-04-22 ENCOUNTER — Encounter (HOSPITAL_COMMUNITY): Payer: Self-pay | Admitting: Emergency Medicine

## 2015-04-22 ENCOUNTER — Emergency Department (HOSPITAL_COMMUNITY)
Admission: EM | Admit: 2015-04-22 | Discharge: 2015-04-22 | Discharge status: Against Medical Advice | Payer: Medicaid Other | Source: Home / Self Care

## 2015-04-22 LAB — URINALYSIS, ROUTINE W REFLEX MICROSCOPIC
GLUCOSE, UA: NEGATIVE mg/dL
HGB URINE DIPSTICK: NEGATIVE
Ketones, ur: 15 mg/dL — AB
LEUKOCYTES UA: NEGATIVE
Nitrite: NEGATIVE
PH: 6 (ref 5.0–8.0)
Protein, ur: NEGATIVE mg/dL
SPECIFIC GRAVITY, URINE: 1.025 (ref 1.005–1.030)

## 2015-04-22 LAB — PREGNANCY, URINE: Preg Test, Ur: NEGATIVE

## 2015-04-22 MED ORDER — DICYCLOMINE HCL 20 MG PO TABS: 20.0000 mg | ORAL_TABLET | Freq: Two times a day (BID) | ORAL | Status: AC

## 2015-04-22 MED ORDER — LOPERAMIDE HCL 2 MG PO CAPS: 2.0000 mg | ORAL_CAPSULE | Freq: Four times a day (QID) | ORAL | Status: AC | PRN

## 2015-04-22 MED ORDER — ONDANSETRON 4 MG PO TBDP: 4.0000 mg | ORAL_TABLET | Freq: Three times a day (TID) | ORAL | Status: AC | PRN

## 2015-04-22 NOTE — ED Notes (Signed)
Pt drinking sprite at this time 

## 2015-04-22 NOTE — ED Notes (Signed)
Called to place patient in room. No answer.  

## 2015-04-22 NOTE — Discharge Instructions (Signed)

## 2015-04-22 NOTE — ED Notes (Signed)
Pt c/o abd pain with n/v/d since yesterday.  

## 2015-04-26 ENCOUNTER — Ambulatory Visit (HOSPITAL_COMMUNITY): Payer: No Typology Code available for payment source

## 2015-04-26 ENCOUNTER — Other Ambulatory Visit (HOSPITAL_COMMUNITY): Payer: Self-pay

## 2015-04-26 ENCOUNTER — Ambulatory Visit (HOSPITAL_COMMUNITY)
Admission: RE | Admit: 2015-04-26 | Discharge: 2015-04-26 | Disposition: A | Discharge status: Home / Self Care | Payer: Medicaid Other | Source: Ambulatory Visit | Attending: Family | Admitting: Family

## 2015-04-26 DIAGNOSIS — L723 Sebaceous cyst: Secondary | ICD-10-CM | POA: Insufficient documentation

## 2015-04-26 DIAGNOSIS — N63 Unspecified lump in unspecified breast: Secondary | ICD-10-CM

## 2017-10-11 ENCOUNTER — Emergency Department (HOSPITAL_COMMUNITY)
Admission: EM | Admit: 2017-10-11 | Discharge: 2017-10-11 | Disposition: A | Discharge status: Home / Self Care | Payer: Medicaid Other | Attending: Emergency Medicine | Admitting: Emergency Medicine

## 2017-10-11 ENCOUNTER — Encounter (HOSPITAL_COMMUNITY): Payer: Self-pay | Admitting: Emergency Medicine

## 2017-10-11 ENCOUNTER — Emergency Department (HOSPITAL_COMMUNITY): Payer: Medicaid Other

## 2017-10-11 ENCOUNTER — Other Ambulatory Visit: Payer: Self-pay

## 2017-10-11 DIAGNOSIS — Y929 Unspecified place or not applicable: Secondary | ICD-10-CM | POA: Diagnosis not present

## 2017-10-11 DIAGNOSIS — Y998 Other external cause status: Secondary | ICD-10-CM | POA: Insufficient documentation

## 2017-10-11 DIAGNOSIS — Z87891 Personal history of nicotine dependence: Secondary | ICD-10-CM | POA: Insufficient documentation

## 2017-10-11 DIAGNOSIS — J45909 Unspecified asthma, uncomplicated: Secondary | ICD-10-CM | POA: Diagnosis not present

## 2017-10-11 DIAGNOSIS — W108XXA Fall (on) (from) other stairs and steps, initial encounter: Secondary | ICD-10-CM | POA: Insufficient documentation

## 2017-10-11 DIAGNOSIS — S060X1A Concussion with loss of consciousness of 30 minutes or less, initial encounter: Secondary | ICD-10-CM | POA: Insufficient documentation

## 2017-10-11 DIAGNOSIS — Y9389 Activity, other specified: Secondary | ICD-10-CM | POA: Insufficient documentation

## 2017-10-11 DIAGNOSIS — F319 Bipolar disorder, unspecified: Secondary | ICD-10-CM | POA: Diagnosis not present

## 2017-10-11 DIAGNOSIS — S0990XA Unspecified injury of head, initial encounter: Secondary | ICD-10-CM | POA: Diagnosis present

## 2017-10-11 DIAGNOSIS — M25512 Pain in left shoulder: Secondary | ICD-10-CM | POA: Insufficient documentation

## 2017-10-11 DIAGNOSIS — F209 Schizophrenia, unspecified: Secondary | ICD-10-CM | POA: Insufficient documentation

## 2017-10-11 MED ORDER — ACETAMINOPHEN 325 MG PO TABS
650.0000 mg | ORAL_TABLET | Freq: Once | ORAL | Status: AC
  Administered 2017-10-11: 650 mg via ORAL
  Filled 2017-10-11: qty 2

## 2017-10-11 MED ORDER — ONDANSETRON 4 MG PO TBDP
4.0000 mg | ORAL_TABLET | Freq: Once | ORAL | Status: AC
  Administered 2017-10-11: 4 mg via ORAL
  Filled 2017-10-11: qty 1

## 2017-10-11 NOTE — ED Provider Notes (Signed)
[facility] EMERGENCY DEPARTMENT Provider Note   CSN: [id number] Arrival date & time: 10/11/17  1405     History   Chief Complaint Chief Complaint  Patient presents with  . Head Injury    HPI Paula Colon is a 32 y.o. female  With a past medical history as outlined below presenting with an acute head injury. 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  She was walking down steps out of the trailer when her feet slipped, fell backward hitting her posterior head and her left shoulder on the step.  She had no loc initially but husband reports she was slumped over in his car and took a minute to respond to him when he came back from inside the store.  She endorses severe headache and nausea without emesis at this time.  Also denies weakness or numbness, dizziness, no other complaint of pain in her back, chest or extremities. She has had no treatment prior to arrival.  She has found no alleviators for her sx.   The history is provided by the patient and the spouse.    Past Medical History:  Diagnosis Date  . Asthma    as a child  . PTSD (post-traumatic stress disorder)     Patient Active Problem List   Diagnosis Date Noted  . Drug overdose 02/09/2013  . Bipolar 1 disorder with moderate mania (HCC) 02/09/2013  . Acute encephalopathy 02/08/2013  . Depression 02/07/2013  . Overdose of antidepressant 02/07/2013  . Schizophrenia (HCC) 02/07/2013  . Bipolar 1 disorder (HCC) 02/07/2013  . Overdose 02/07/2013    Past Surgical History:  Procedure Laterality Date  . DILATION AND CURETTAGE OF UTERUS    . TUBAL LIGATION       OB History   None      Home Medications    Prior to Admission medications   Medication Sig Start Date End Date Taking? Authorizing Provider  dicyclomine (BENTYL) 20 MG tablet Take 1 tablet (20 mg total) by mouth 2 (two) times daily. As needed for  abdominal cramping 04/22/15   Ward, Layla Maw, DO  loperamide (IMODIUM) 2 MG capsule Take 1 capsule (2 mg total) by mouth 4 (four) times daily as needed for diarrhea or loose stools. 04/22/15   Ward, Layla Maw, DO  ondansetron (ZOFRAN ODT) 4 MG disintegrating tablet Take 1 tablet (4 mg total) by mouth every 8 (eight) hours as needed for nausea or vomiting. 04/22/15   Ward, Layla Maw, DO    Family History History reviewed. No pertinent family history.  Social History Social History   Tobacco Use  . Smoking status: Former Smoker    Packs/day: 2.00    Last attempt to quit: 02/10/2013    Years since quitting: 4.6  . Smokeless tobacco: Never Used  Substance Use Topics  . Alcohol use: No  . Drug use: No     Allergies   Bee venom; Mushroom extract complex; Abilify [aripiprazole]; Penicillins; and Seroquel [quetiapine fumarate]   Review of Systems Review of Systems  Constitutional: Negative for fever.  HENT: Negative.   Gastrointestinal: Positive for nausea. Negative for vomiting.  Musculoskeletal: Positive for arthralgias. Negative for back pain, joint swelling, myalgias, neck pain and neck stiffness.  Skin: Negative for wound.  Neurological: Positive for headaches. Negative for dizziness, speech difficulty, weakness, light-headedness and numbness.     Physical  Exam Updated Vital Signs BP (!) 102/53   Pulse 85   Temp 98.6 F (37 C) (Oral)   Resp (!) 23   Ht 5\' 3"  (1.6 m)   Wt 81.2 kg   LMP 09/16/2017   SpO2 98%   BMI 31.71 kg/m   Physical Exam  Constitutional: She is oriented to person, place, and time. She appears well-developed and well-nourished. No distress.  Uncomfortable appearing, appears drowsy, keeps eyes closed during interview.  HENT:  Head: Normocephalic and atraumatic.  Mouth/Throat: Oropharynx is clear and moist.  Eyes: Pupils are equal, round, and reactive to light. EOM are normal.  Neck: Spinous process tenderness and muscular tenderness present.    Cardiovascular: Normal rate and normal heart sounds.  Pulses:      Radial pulses are 2+ on the right side, and 2+ on the left side.       Dorsalis pedis pulses are 2+ on the right side, and 2+ on the left side.  Pulmonary/Chest: Effort normal.  Abdominal: Soft. There is no tenderness.  Musculoskeletal: Normal range of motion.  Lymphadenopathy:    She has no cervical adenopathy.  Neurological: She is oriented to person, place, and time. She has normal strength. No sensory deficit. Gait normal. GCS eye subscore is 4. GCS verbal subscore is 5. GCS motor subscore is 6.  Normal heel-shin, normal rapid alternating movements. Cranial nerves III-XII intact.  No pronator drift. Equal grip strength. Moves all extremities, left arm limited by shoulder pain.  Skin: Skin is warm and dry. No rash noted.  Psychiatric: She has a normal mood and affect. Her speech is normal and behavior is normal. Thought content normal. Cognition and memory are normal.  Nursing note and vitals reviewed.    ED Treatments / Results  Labs (all labs ordered are listed, but only abnormal results are displayed) Labs Reviewed - No data to display  EKG None  Radiology Ct Head Wo Contrast  Result Date: 10/11/2017 CLINICAL DATA:  32 year old and head trauma. Complains of left-sided head pain. EXAM: CT HEAD WITHOUT CONTRAST CT CERVICAL SPINE WITHOUT CONTRAST TECHNIQUE: Multidetector CT imaging of the head and cervical spine was performed following the standard protocol without intravenous contrast. Multiplanar CT image reconstructions of the cervical spine were also generated. COMPARISON:  01/12/2012 FINDINGS: CT HEAD FINDINGS Brain: No evidence of acute infarction, hemorrhage, hydrocephalus, extra-axial collection or mass lesion/mass effect. Vascular: No hyperdense vessel or unexpected calcification. Skull: Normal. Negative for fracture or focal lesion. Sinuses/Orbits: Mild mucosal thickening in the ethmoid air cells. Other:  None. CT CERVICAL SPINE FINDINGS Alignment: Normal. Skull base and vertebrae: No acute fracture. No primary bone lesion or focal pathologic process. Soft tissues and spinal canal: No prevertebral fluid or swelling. No visible canal hematoma. Disc levels: Disc spaces are maintained. Small disc bulge at C6-C7. Upper chest: Negative. Other: Negative IMPRESSION: Negative head CT. No acute abnormality to the cervical spine. Electronically Signed   By: Richarda Overlie M.D.   On: 10/11/2017 15:03   Ct Cervical Spine Wo Contrast  Result Date: 10/11/2017 CLINICAL DATA:  32 year old and head trauma. Complains of left-sided head pain. EXAM: CT HEAD WITHOUT CONTRAST CT CERVICAL SPINE WITHOUT CONTRAST TECHNIQUE: Multidetector CT imaging of the head and cervical spine was performed following the standard protocol without intravenous contrast. Multiplanar CT image reconstructions of the cervical spine were also generated. COMPARISON:  01/12/2012 FINDINGS: CT HEAD FINDINGS Brain: No evidence of acute infarction, hemorrhage, hydrocephalus, extra-axial collection or mass lesion/mass effect. Vascular: No hyperdense  vessel or unexpected calcification. Skull: Normal. Negative for fracture or focal lesion. Sinuses/Orbits: Mild mucosal thickening in the ethmoid air cells. Other: None. CT CERVICAL SPINE FINDINGS Alignment: Normal. Skull base and vertebrae: No acute fracture. No primary bone lesion or focal pathologic process. Soft tissues and spinal canal: No prevertebral fluid or swelling. No visible canal hematoma. Disc levels: Disc spaces are maintained. Small disc bulge at C6-C7. Upper chest: Negative. Other: Negative IMPRESSION: Negative head CT. No acute abnormality to the cervical spine. Electronically Signed   By: Richarda OverlieAdam  Henn M.D.   On: 10/11/2017 15:03   Dg Shoulder Left  Result Date: 10/11/2017 CLINICAL DATA:  fall EXAM: LEFT SHOULDER - 2+ VIEW COMPARISON:  None. FINDINGS: No acute fracture. No dislocation.  Unremarkable soft  tissues. IMPRESSION: No acute bony pathology. Electronically Signed   By: Jolaine ClickArthur  Hoss M.D.   On: 10/11/2017 15:06    Procedures Procedures (including critical care time)  Medications Ordered in ED Medications  ondansetron (ZOFRAN-ODT) disintegrating tablet 4 mg (4 mg Oral Given 10/11/17 1430)  acetaminophen (TYLENOL) tablet 650 mg (650 mg Oral Given 10/11/17 1430)     Initial Impression / Assessment and Plan / ED Course  I have reviewed the triage vital signs and the nursing notes.  Pertinent labs & imaging results that were available during my care of the patient were reviewed by me and considered in my medical decision making (see chart for details).     Pt with head injury, falling from standing position with equivocal delayed loc after the event with normal imaging at this time.  Sx suggesting concussion, no neuro deficits on exam.  She and husband was given home instructions with strict return precautions for any changes in her sx. Advised rest, ibuprofen or tylenol for headache, recheck x 1 week if sx are not resolved and also advised no driving x 1 week or until sx resolved. Referrals given for f/u care and/or recheck here as needed.  Pt and husband understand plan.  Final Clinical Impressions(s) / ED Diagnoses   Final diagnoses:  Concussion with loss of consciousness of 30 minutes or less, initial encounter    ED Discharge Orders    None       Victoriano Laindol, Caison Hearn, PA-C 10/12/17 0645    Raeford RazorKohut, Stephen, MD 10/12/17 1133

## 2017-10-11 NOTE — ED Triage Notes (Signed)
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 Complaining of pain to left side of head and left shoulder. Also states she is having trouble seeing. Per friend with patient, states patient got up after falling and got back into truck, he went into store but when he came out she was slumped over in the seat.

## 2017-10-11 NOTE — Discharge Instructions (Signed)
Refer to the concussion information below.  Rest, take ibuprofen or Tylenol as needed for headache pain relief.  Get rechecked for any worsening symptoms as outlined below.  As discussed your CT images and plain film x-rays are negative today for acute bleeding in your brain or skull fracture I suspect you do have a concussion.  You will need to get rechecked in 1 week before returning to work if your symptoms are not completely gone.  See the referrals above for follow-up care.  Return here for any worsening symptoms.

## 2019-03-27 IMAGING — CT CT HEAD W/O CM
5 of 7 series · 17 of 47 positions shown, 18 images · non-contrast
Comparison: 01/12/2012

CLINICAL DATA: 32-year-old and head trauma. Complains of left-sided
head pain.

EXAM:
CT HEAD WITHOUT CONTRAST
CT CERVICAL SPINE WITHOUT CONTRAST
TECHNIQUE: Multidetector CT imaging of the head and cervical spine was
performed following the standard protocol without intravenous
contrast. Multiplanar CT image reconstructions of the cervical spine
were also generated.

[Series 2: head wo · axial · 0.43mm/px · z∈[-185,-105]mm · 3 of 32 slices shown, 4 images]
[im 8/32  brain]
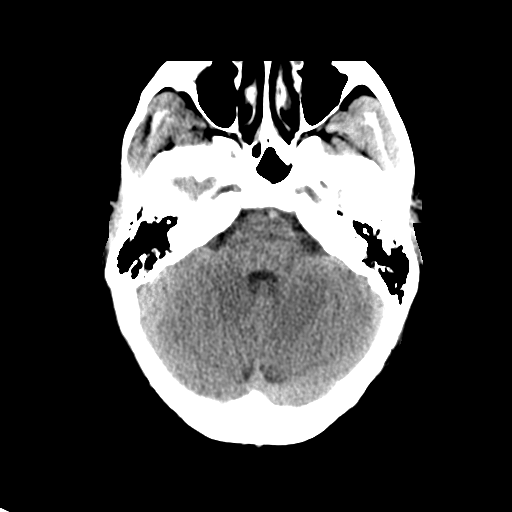
[im 8/32  bone]
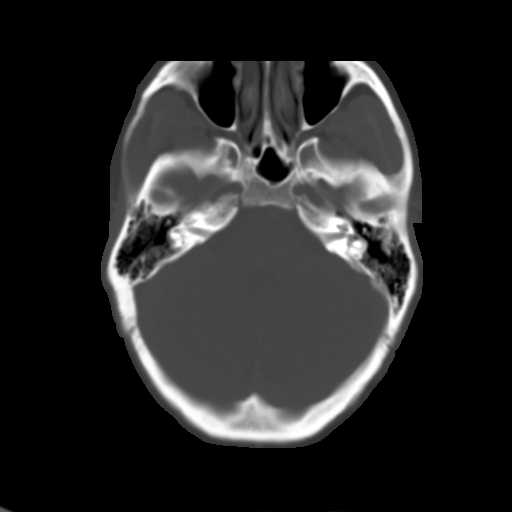
[im 16/32  brain]
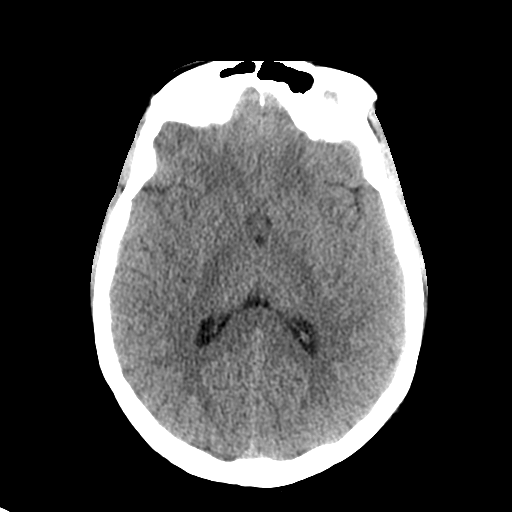
[im 24/32  brain]
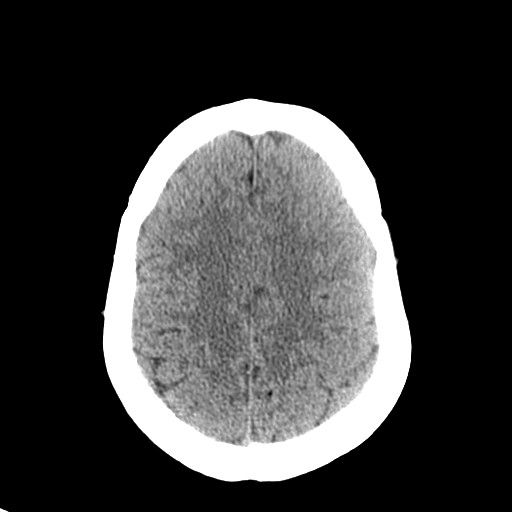

[Series 4: coronal soft tissue · coronal · 0.31mm/px · 2 of 70 slices shown]
[im 12/70  brain]
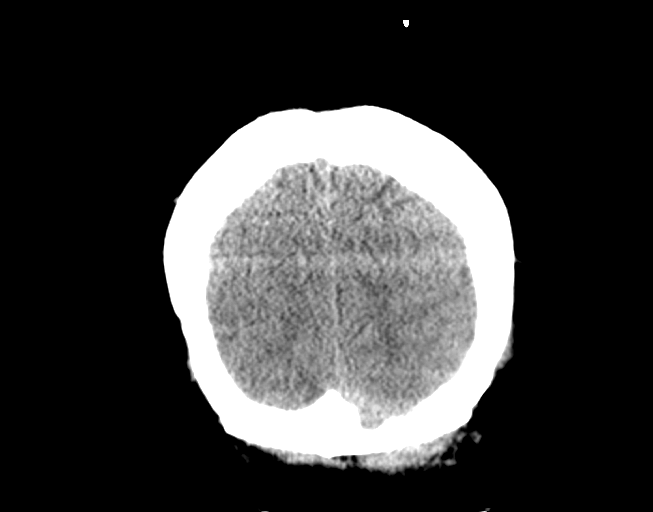
[im 23/70  brain]
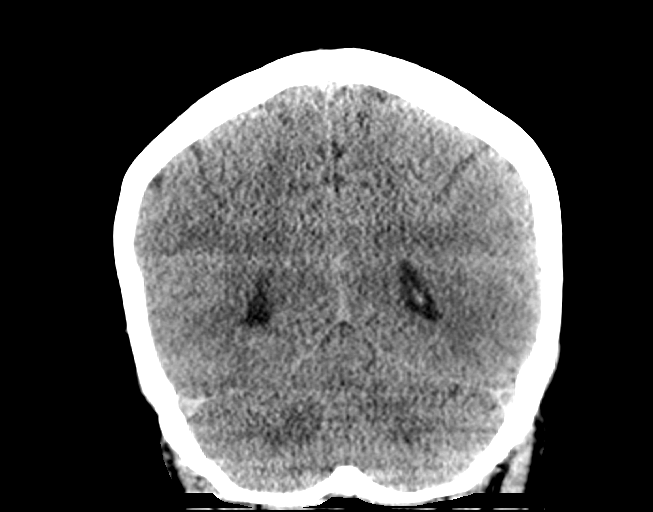

[Series 5: sagittal soft tissue · sagittal · 0.31mm/px · 2 of 59 slices shown]
[im 20/59  brain]
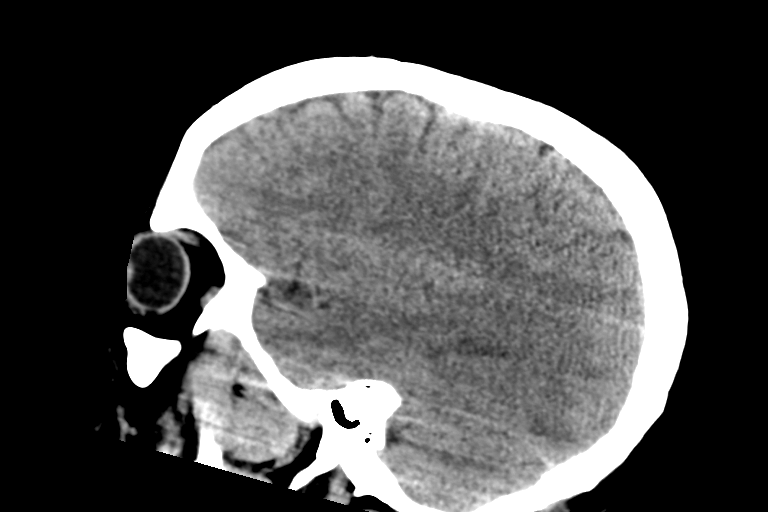
[im 39/59  brain]
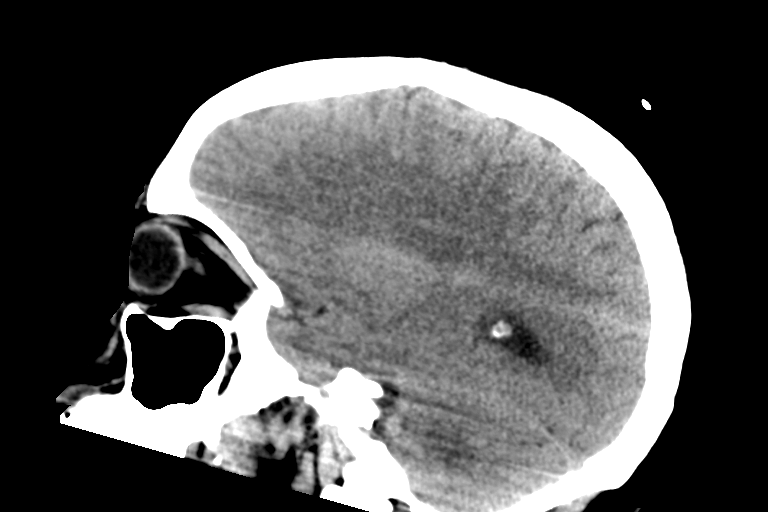

[Series 7: c spine soft · axial · 0.21mm/px · z∈[-366,-336]mm · 2 of 91 slices shown]
[im 8/91  brain]
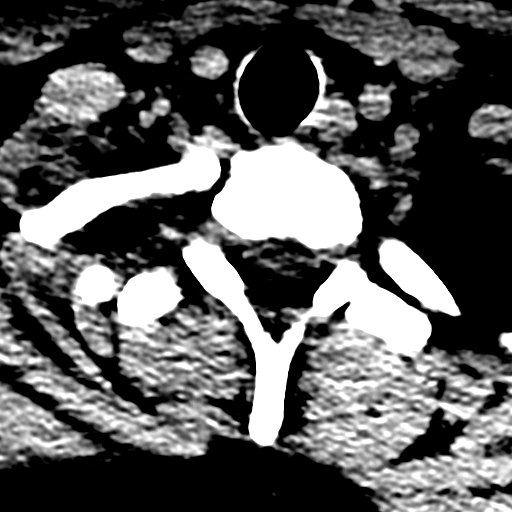
[im 23/91  brain]
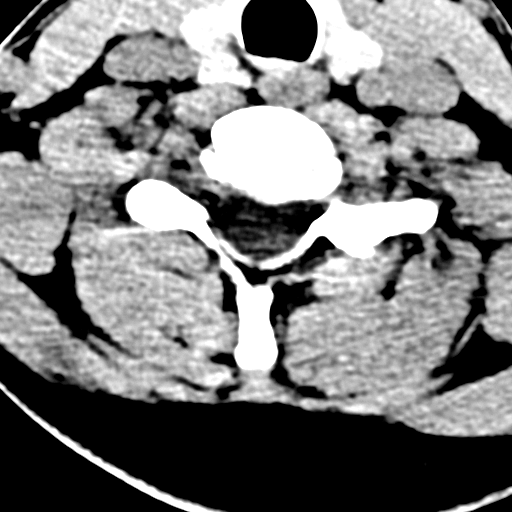

[Series 11: orthogonal bone · axial · 0.21mm/px · z∈[-380,-246]mm · 8 of 83 slices shown]
[im 8/83  bone]
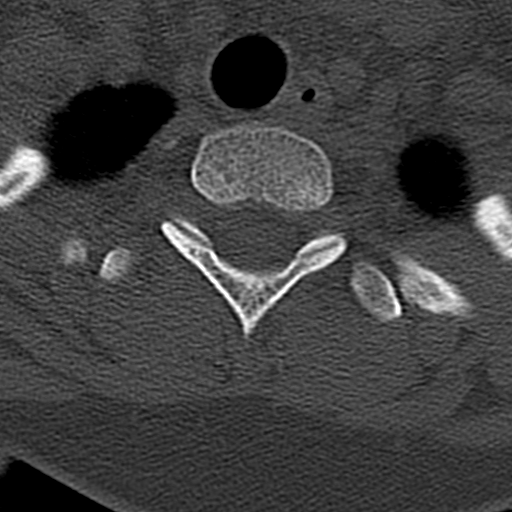
[im 15/83  bone]
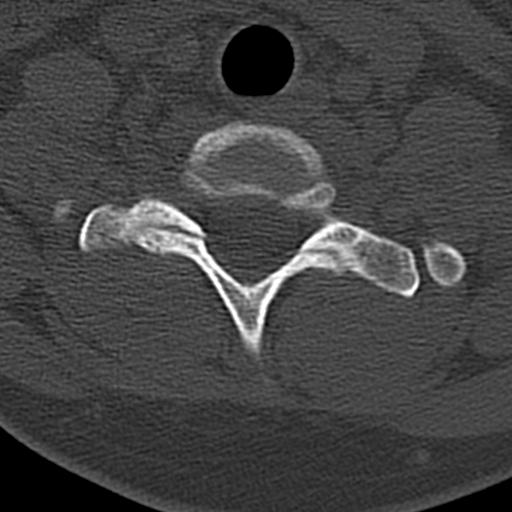
[im 30/83  bone]
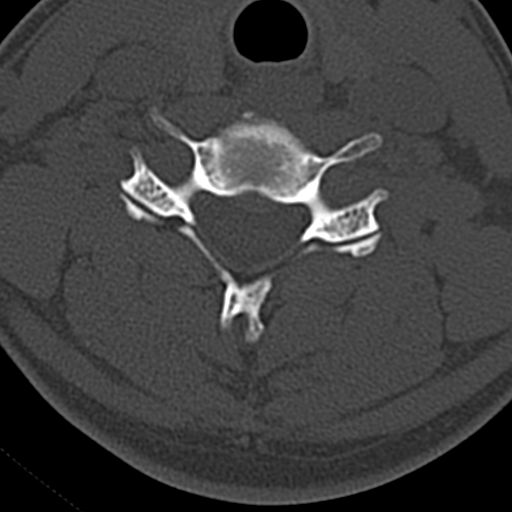
[im 38/83  bone]
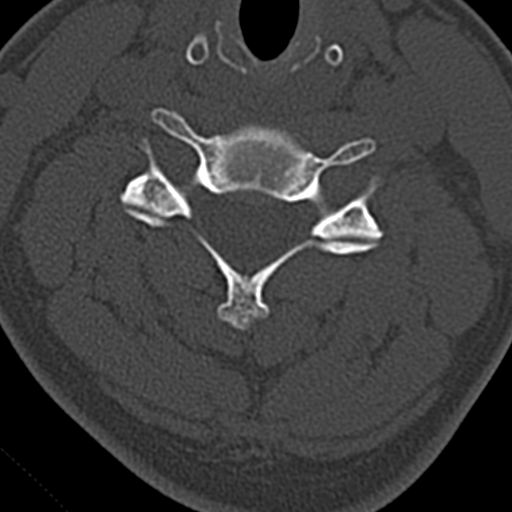
[im 45/83  bone]
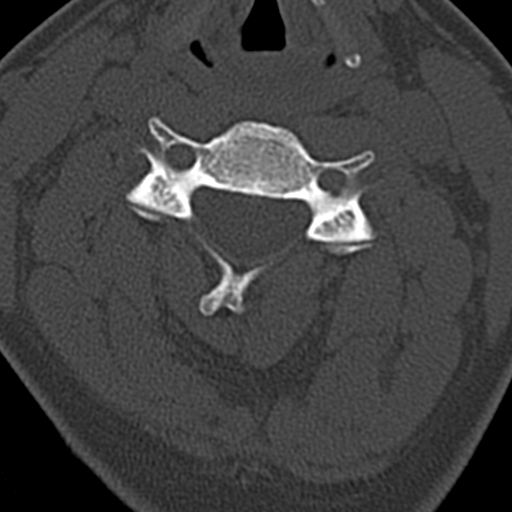
[im 53/83  bone]
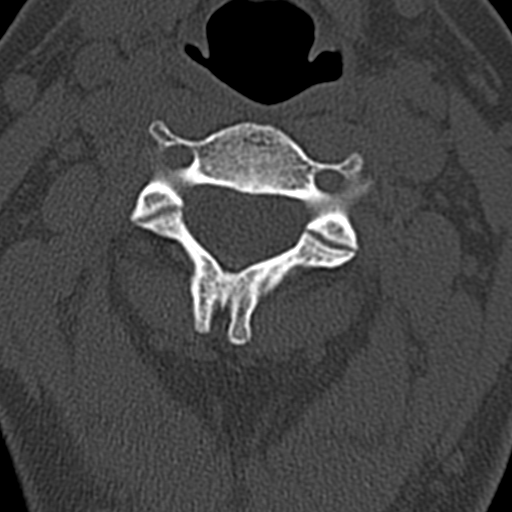
[im 68/83  bone]
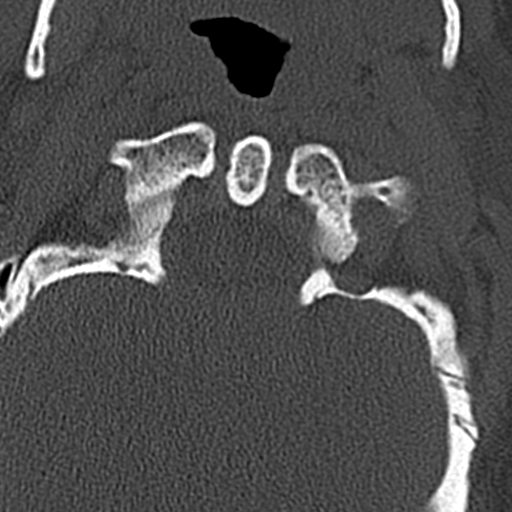
[im 75/83  bone]
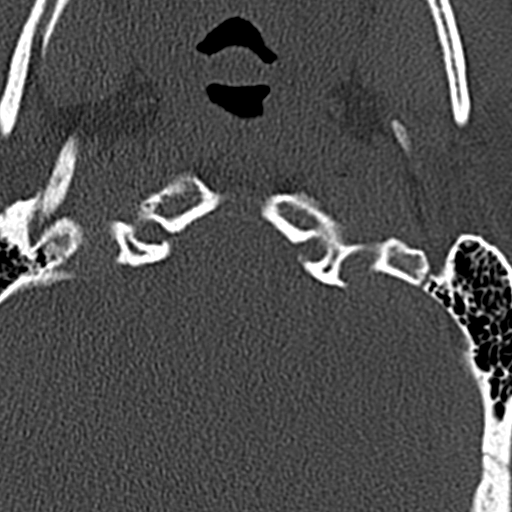

[17 of 47 positions shown; findings below may reference images not displayed]

FINDINGS: CT HEAD FINDINGS

Brain: No evidence of acute infarction, hemorrhage, hydrocephalus,
extra-axial collection or mass lesion/mass effect.

Vascular: No hyperdense vessel or unexpected calcification.

Skull: Normal. Negative for fracture or focal lesion.

Sinuses/Orbits: Mild mucosal thickening in the ethmoid air cells.

Other: None.

CT CERVICAL SPINE FINDINGS

Alignment: Normal.

Skull base and vertebrae: No acute fracture. No primary bone lesion
or focal pathologic process.

Soft tissues and spinal canal: No prevertebral fluid or swelling. No
visible canal hematoma.

Disc levels: Disc spaces are maintained. Small disc bulge at C6-C7.

Upper chest: Negative.

Other: Negative
IMPRESSION: Negative head CT.

No acute abnormality to the cervical spine.
